# Patient Record
Sex: Female | Born: 1955 | Race: White | Hispanic: No | Marital: Married | Smoking: Never smoker
Health system: Southern US, Community
[De-identification: ages and names within clinical notes are randomized; demographics above are authoritative.]

## PROBLEM LIST (undated history)

## (undated) DIAGNOSIS — E785 Hyperlipidemia, unspecified: Secondary | ICD-10-CM

## (undated) DIAGNOSIS — I10 Essential (primary) hypertension: Secondary | ICD-10-CM

## (undated) DIAGNOSIS — Z860100 Personal history of colon polyps, unspecified: Secondary | ICD-10-CM

## (undated) DIAGNOSIS — R011 Cardiac murmur, unspecified: Secondary | ICD-10-CM

## (undated) DIAGNOSIS — M1990 Unspecified osteoarthritis, unspecified site: Secondary | ICD-10-CM

## (undated) HISTORY — DX: Unspecified osteoarthritis, unspecified site: M19.90

## (undated) HISTORY — DX: Personal history of colonic polyps: Z86.010

## (undated) HISTORY — DX: Hyperlipidemia, unspecified: E78.5

## (undated) HISTORY — DX: Personal history of colon polyps, unspecified: Z86.0100

## (undated) HISTORY — DX: Essential (primary) hypertension: I10

## (undated) HISTORY — DX: Cardiac murmur, unspecified: R01.1

## (undated) HISTORY — PX: COLONOSCOPY: SHX174

## (undated) HISTORY — PX: TUBAL LIGATION: SHX77

---

## 2005-05-28 HISTORY — PX: KNEE ARTHROSCOPY AND ARTHROTOMY: SUR84

## 2009-01-10 ENCOUNTER — Encounter: Payer: Self-pay | Admitting: Family Medicine

## 2009-02-08 ENCOUNTER — Ambulatory Visit: Payer: Self-pay | Admitting: Family Medicine

## 2009-02-11 ENCOUNTER — Ambulatory Visit: Payer: Self-pay | Admitting: Family Medicine

## 2009-02-11 DIAGNOSIS — I10 Essential (primary) hypertension: Secondary | ICD-10-CM | POA: Insufficient documentation

## 2009-02-11 DIAGNOSIS — M159 Polyosteoarthritis, unspecified: Secondary | ICD-10-CM | POA: Insufficient documentation

## 2009-02-11 DIAGNOSIS — R011 Cardiac murmur, unspecified: Secondary | ICD-10-CM | POA: Insufficient documentation

## 2009-02-11 DIAGNOSIS — E782 Mixed hyperlipidemia: Secondary | ICD-10-CM | POA: Insufficient documentation

## 2009-02-11 DIAGNOSIS — Z86010 Personal history of colonic polyps: Secondary | ICD-10-CM | POA: Insufficient documentation

## 2009-02-14 LAB — CONVERTED CEMR LAB
Basophils Absolute: 0 10*3/uL (ref 0.0–0.1)
Basophils Relative: 0.4 % (ref 0.0–3.0)
Eosinophils Absolute: 0.1 10*3/uL (ref 0.0–0.7)
Lymphocytes Relative: 21.3 % (ref 12.0–46.0)
MCHC: 33.9 g/dL (ref 30.0–36.0)
Neutrophils Relative %: 64.8 % (ref 43.0–77.0)
RBC: 4.33 M/uL (ref 3.87–5.11)
TSH: 0.82 microintl units/mL (ref 0.35–5.50)
WBC: 5.3 10*3/uL (ref 4.5–10.5)

## 2009-03-22 ENCOUNTER — Encounter (INDEPENDENT_AMBULATORY_CARE_PROVIDER_SITE_OTHER): Payer: Self-pay | Admitting: *Deleted

## 2009-05-25 ENCOUNTER — Ambulatory Visit: Payer: Self-pay | Admitting: Family Medicine

## 2009-05-25 DIAGNOSIS — L02619 Cutaneous abscess of unspecified foot: Secondary | ICD-10-CM | POA: Insufficient documentation

## 2009-05-25 DIAGNOSIS — L03039 Cellulitis of unspecified toe: Secondary | ICD-10-CM

## 2009-06-16 ENCOUNTER — Ambulatory Visit: Payer: Self-pay | Admitting: Family Medicine

## 2009-06-16 DIAGNOSIS — B353 Tinea pedis: Secondary | ICD-10-CM | POA: Insufficient documentation

## 2009-07-11 ENCOUNTER — Encounter: Payer: Self-pay | Admitting: Family Medicine

## 2009-07-20 ENCOUNTER — Encounter: Payer: Self-pay | Admitting: Family Medicine

## 2010-01-11 ENCOUNTER — Other Ambulatory Visit: Admission: RE | Admit: 2010-01-11 | Discharge: 2010-01-11 | Payer: Self-pay | Admitting: Obstetrics and Gynecology

## 2010-01-25 ENCOUNTER — Ambulatory Visit: Payer: Self-pay | Admitting: Family Medicine

## 2010-01-25 LAB — CONVERTED CEMR LAB
Bilirubin Urine: NEGATIVE
Blood in Urine, dipstick: NEGATIVE
Glucose, Urine, Semiquant: NEGATIVE
Ketones, urine, test strip: NEGATIVE
Nitrite: NEGATIVE
Protein, U semiquant: NEGATIVE
Specific Gravity, Urine: 1.015
Urobilinogen, UA: 0.2
pH: 7.5

## 2010-01-27 LAB — CONVERTED CEMR LAB
ALT: 23 units/L (ref 0–35)
Albumin: 4.3 g/dL (ref 3.5–5.2)
BUN: 14 mg/dL (ref 6–23)
Basophils Absolute: 0 10*3/uL (ref 0.0–0.1)
CO2: 30 meq/L (ref 19–32)
Chloride: 104 meq/L (ref 96–112)
Cholesterol: 174 mg/dL (ref 0–200)
Glucose, Bld: 87 mg/dL (ref 70–99)
HCT: 39.4 % (ref 36.0–46.0)
Lymphs Abs: 1.3 10*3/uL (ref 0.7–4.0)
MCV: 91.2 fL (ref 78.0–100.0)
Monocytes Absolute: 0.4 10*3/uL (ref 0.1–1.0)
Neutro Abs: 2.4 10*3/uL (ref 1.4–7.7)
Platelets: 223 10*3/uL (ref 150.0–400.0)
Potassium: 4.4 meq/L (ref 3.5–5.1)
RDW: 13.8 % (ref 11.5–14.6)
TSH: 1.17 microintl units/mL (ref 0.35–5.50)
Total Bilirubin: 0.6 mg/dL (ref 0.3–1.2)

## 2010-02-01 ENCOUNTER — Ambulatory Visit: Payer: Self-pay | Admitting: Family Medicine

## 2010-02-15 ENCOUNTER — Ambulatory Visit (HOSPITAL_COMMUNITY): Admission: RE | Admit: 2010-02-15 | Discharge: 2010-02-15 | Payer: Self-pay | Admitting: Obstetrics and Gynecology

## 2010-03-21 ENCOUNTER — Ambulatory Visit: Payer: Self-pay | Admitting: Family Medicine

## 2010-03-21 DIAGNOSIS — L84 Corns and callosities: Secondary | ICD-10-CM | POA: Insufficient documentation

## 2010-04-03 ENCOUNTER — Telehealth: Payer: Self-pay | Admitting: Family Medicine

## 2010-06-27 NOTE — Assessment & Plan Note (Signed)
Summary: cpx//ccm   Vital Signs:  Patient profile:   55 year old female Height:      66.5 inches Weight:      181 pounds BMI:     28.88 O2 Sat:      98 % Temp:     98.4 degrees F Pulse rate:   77 / minute BP sitting:   140 / 90  (left arm)  Vitals Entered By: Pura Spice, RN (February 01, 2010 3:05 PM) CC: cpx discuss labs rx to [facility]  No Pap sees Dr Ginnie Smart last pap aug 2011 Is Patient Diabetic? No   History of Present Illness: 55 yr old female for a cpx. She feels fine and has no concerns.   Allergies: 1)  ! Sulfa  Past History:  Past Medical History: Colonic polyps, hx of Hyperlipidemia Hypertension Osteoarthritis Heart Murmur sees Dr. Patsy Baltimore for GYN exams  Past Surgical History: Reviewed history from 02/11/2009 and no changes required. arthroscopy for Torn meniscus lt knee 2007 Tubal ligation 1995 colonoscopy 2009, to repeat in 5 yrs  Past History:  Care Management: Gynecology: Dr Ginnie Smart  Family History: Reviewed history from 02/11/2009 and no changes required. Family History of Alcoholism/Addiction Family History of Arthritis Family History of CAD Female 1st degree relative <50 Family History Diabetes 1st degree relative Family History Lung cancer  Social History: Reviewed history from 02/11/2009 and no changes required. Married Never Smoked Alcohol use-yes Drug use-no Regular exercise-yes  Review of Systems  The patient denies anorexia, fever, weight loss, weight gain, vision loss, decreased hearing, hoarseness, chest pain, syncope, dyspnea on exertion, peripheral edema, prolonged cough, headaches, hemoptysis, abdominal pain, melena, hematochezia, severe indigestion/heartburn, hematuria, incontinence, genital sores, muscle weakness, suspicious skin lesions, transient blindness, difficulty walking, depression, unusual weight change, abnormal bleeding, enlarged lymph nodes, angioedema, breast masses, and testicular masses.           Flu Vaccine Consent Questions     Do you have a history of severe allergic reactions to this vaccine? no    Any prior history of allergic reactions to egg and/or gelatin? no    Do you have a sensitivity to the preservative Thimersol? no    Do you have a past history of Guillan-Barre Syndrome? no    Do you currently have an acute febrile illness? no    Have you ever had a severe reaction to latex? no    Vaccine information given and explained to patient? yes    Are you currently pregnant? no    Lot Number:[id number]   Exp Date:11/24/2009   Site Given  Left Deltoid IM Pura Spice, RN  February 01, 2010 3:09 PM   Physical Exam  General:  Well-developed,well-nourished,in no acute distress; alert,appropriate and cooperative throughout examination Head:  Normocephalic and atraumatic without obvious abnormalities. No apparent alopecia or balding. Eyes:  No corneal or conjunctival inflammation noted. EOMI. Perrla. Funduscopic exam benign, without hemorrhages, exudates or papilledema. Vision grossly normal. Ears:  External ear exam shows no significant lesions or deformities.  Otoscopic examination reveals clear canals, tympanic membranes are intact bilaterally without bulging, retraction, inflammation or discharge. Hearing is grossly normal bilaterally. Nose:  External nasal examination shows no deformity or inflammation. Nasal mucosa are pink and moist without lesions or exudates. Mouth:  Oral mucosa and oropharynx without lesions or exudates.  Teeth in good repair. Neck:  No deformities, masses, or tenderness noted. Chest Wall:  No deformities, masses, or tenderness noted. Lungs:  Normal respiratory  effort, chest expands symmetrically. Lungs are clear to auscultation, no crackles or wheezes. Heart:  Normal rate and regular rhythm. S1 and S2 normal without gallop, murmur, click, rub or other extra sounds. EKG normal Abdomen:  Bowel sounds positive,abdomen soft and non-tender without  masses, organomegaly or hernias noted. Msk:  No deformity or scoliosis noted of thoracic or lumbar spine.   Pulses:  R and L carotid,radial,femoral,dorsalis pedis and posterior tibial pulses are full and equal bilaterally Extremities:  No clubbing, cyanosis, edema, or deformity noted with normal full range of motion of all joints.   Neurologic:  No cranial nerve deficits noted. Station and gait are normal. Plantar reflexes are down-going bilaterally. DTRs are symmetrical throughout. Sensory, motor and coordinative functions appear intact. Skin:  Intact without suspicious lesions or rashes Cervical Nodes:  No lymphadenopathy noted Axillary Nodes:  No palpable lymphadenopathy Inguinal Nodes:  No significant adenopathy Psych:  Cognition and judgment appear intact. Alert and cooperative with normal attention span and concentration. No apparent delusions, illusions, hallucinations   Impression & Recommendations:  Problem # 1:  HEALTH MAINTENANCE EXAM (ICD-V70.0)  Orders: EKG w/ Interpretation (93000)  Complete Medication List: 1)  Micardis Hct 40-12.5 Mg Tabs (Telmisartan-hctz) .[name].. 1 by mouth once daily 2)  Calcium 1500 Mg Tabs (Calcium carbonate) .... Once daily 3)  B Complex 100 Tabs (B complex vitamins) .... Once daily 4)  Zocor 40 Mg Tabs (Simvastatin) .... Once daily 5)  Ketoconazole 2 % Crea (Ketoconazole) .... Apply three times a day as needed  Other Orders: Admin 1st Vaccine (81191) Flu Vaccine 10yrs + (47829)  Patient Instructions: 1)  Please schedule a follow-up appointment in 6 months .  Prescriptions: ZOCOR 40 MG TABS (SIMVASTATIN) once daily  #30 x 11   Entered and Authorized by:   Nelwyn Salisbury MD   Signed by:   Nelwyn Salisbury MD on 02/01/2010   Method used:   Electronically to        [facility] [address] [phone]* (retail)       [address] [place], [place]  [address]       Ph: [phone]       Fax: [phone]   RxID:   [id number] MICARDIS HCT 40-12.5 MG TABS  (TELMISARTAN-HCTZ) 1 by mouth once daily  #30 x 11   Entered and Authorized by:   Nelwyn Salisbury MD   Signed by:   Nelwyn Salisbury MD on 02/01/2010   Method used:   Electronically to        [facility] [address] [phone]* (retail)       [address] [place], [place]  [address]       Ph: [phone]       Fax: [phone]   RxID:   [id number]

## 2010-06-27 NOTE — Assessment & Plan Note (Signed)
Summary: right knee pain/toe recheck/njr   Vital Signs:  Patient profile:   55 year old female Weight:      178 pounds Temp:     98.0 degrees F oral Pulse rate:   90 / minute BP sitting:   114 / 76  (left arm) Cuff size:   large  Vitals Entered By: Alfred Levins, CMA (June 16, 2009 8:43 AM) CC: lt toe infected, rt knee swollen   History of Present Illness: Here for continued infection on the left toes and for right knee pain. She was here last month for the toes and was given Keflex. The area improved but she still has irritation in the area. Also she has had swelling and pain in the right knee for several weeks. it started after she ran a 5K race last month, and she does not usually run much. In the gym she has stopped using the treadmill and is using an elliptical machine instead. Using Motrin which works well but does not last very long.   Current Medications (verified): 1)  Micardis Hct 40-12.5 Mg Tabs (Telmisartan-Hctz) .[name].. 1 By Mouth Once Daily 2)  Calcium 1500 Mg Tabs (Calcium Carbonate) .... Once Daily 3)  B Complex 100  Tabs (B Complex Vitamins) .... Once Daily 4)  Zocor 40 Mg Tabs (Simvastatin) .... Once Daily  Allergies (verified): 1)  ! Sulfa  Past History:  Past Medical History: Reviewed history from 02/11/2009 and no changes required. Colonic polyps, hx of Hyperlipidemia Hypertension Osteoarthritis Heart Murmur  Past Surgical History: Reviewed history from 02/11/2009 and no changes required. arthroscopy for Torn meniscus lt knee 2007 Tubal ligation 1995 colonoscopy 2009, to repeat in 5 yrs  Review of Systems  The patient denies anorexia, fever, weight loss, weight gain, vision loss, decreased hearing, hoarseness, chest pain, syncope, dyspnea on exertion, peripheral edema, prolonged cough, headaches, hemoptysis, abdominal pain, melena, hematochezia, severe indigestion/heartburn, hematuria, incontinence, genital sores, muscle weakness, suspicious skin  lesions, transient blindness, difficulty walking, depression, unusual weight change, abnormal bleeding, enlarged lymph nodes, angioedema, breast masses, and testicular masses.    Physical Exam  General:  Well-developed,well-nourished,in no acute distress; alert,appropriate and cooperative throughout examination Extremities:  the right knee has a lot of crepitus and is slightly puffy around the joint spaces. Joint spaces are mildly tender. ROM is full Skin:  the webbing between the left 4th and 5th toes is red and macerated   Impression & Recommendations:  Problem # 1:  TINEA PEDIS (ICD-110.4)  Her updated medication list for this problem includes:    Ketoconazole 2 % Crea (Ketoconazole) .[name]... Apply three times a day as needed  Problem # 2:  OSTEOARTHRITIS (ICD-715.90)  Her updated medication list for this problem includes:    Aleve 220 Mg Tabs (Naproxen sodium) .[name]... 1-2 two times a day as needed  Complete Medication List: 1)  Micardis Hct 40-12.5 Mg Tabs (Telmisartan-hctz) .[name].. 1 by mouth once daily 2)  Calcium 1500 Mg Tabs (Calcium carbonate) .... Once daily 3)  B Complex 100 Tabs (B complex vitamins) .... Once daily 4)  Zocor 40 Mg Tabs (Simvastatin) .... Once daily 5)  Ketoconazole 2 % Crea (Ketoconazole) .... Apply three times a day as needed 6)  Aleve 220 Mg Tabs (Naproxen sodium) .[name].. 1-2 two times a day as needed  Patient Instructions: 1)  The cellulitis in between the toes has resolved but the underlying fungal infection remains. Keep the area dry and go barefoot as much as possible. Apply ketoconazole. Try Albertine Grates  for the knee pain, and avoid running for now. Prescriptions: KETOCONAZOLE 2 % CREA (KETOCONAZOLE) apply three times a day as needed  #60 x 2   Entered and Authorized by:   Nelwyn Salisbury MD   Signed by:   Nelwyn Salisbury MD on 06/16/2009   Method used:   Electronically to        [facility] [address] [phone]* (retail)       [address] [place], [place]   [address]       Ph: [phone]       Fax: [phone]   RxID:   [id number]

## 2010-06-27 NOTE — Progress Notes (Signed)
Summary: rx micardis   Phone Note From Pharmacy   Caller: [facility] [address] [phone]* Summary of Call: refill micardis  Initial call taken by: Pura Spice, RN,  April 03, 2010 3:55 PM  Follow-up for Phone Call        done.  Follow-up by: Pura Spice, RN,  April 03, 2010 3:56 PM    Prescriptions: MICARDIS HCT 40-12.5 MG TABS (TELMISARTAN-HCTZ) 1 by mouth once daily  #30 x 6   Entered by:   Pura Spice, RN   Authorized by:   Nelwyn Salisbury MD   Signed by:   Pura Spice, RN on 04/03/2010   Method used:   Electronically to        [facility] [address] [phone]* (retail)       [address] [place], [place]  [address]       Ph: [phone]       Fax: [phone]   RxID:   [id number]

## 2010-06-27 NOTE — Letter (Signed)
Summary: [facility]  [facility]   Imported By: Maryln Gottron 07/27/2009 13:32:50  _____________________________________________________________________  External Attachment:    Type:   Image     Comment:   External Document

## 2010-06-27 NOTE — Assessment & Plan Note (Signed)
Summary: PROBLEM WITH TOE//SLM   Vital Signs:  Patient profile:   55 year old female Weight:      184 pounds BMI:     29.36 O2 Sat:      98 % Temp:     98.7 degrees F Pulse rate:   93 / minute BP sitting:   134 / 90  (left arm)  Vitals Entered By: Pura Spice, RN (March 21, 2010 2:06 PM)  Contraindications/Deferment of Procedures/Staging:    Test/Procedure: FLU VAX    Reason for deferment: patient declined  CC: reck toe area between 4th and 5th toe .   History of Present Illness: Here with irritation between the 4th and 5th toes of the left foot that has bothered her for about 2 weeks. This area developed a cellulitis a year ago. She thinks it doesn't bother her during the warm weather months because she wears sandals or losse fiiting shoes. Then with coller weather she wears tightershoes that compress this area.   Allergies: 1)  ! Sulfa  Past History:  Past Medical History: Reviewed history from 02/01/2010 and no changes required. Colonic polyps, hx of Hyperlipidemia Hypertension Osteoarthritis Heart Murmur sees Dr. Patsy Baltimore for GYN exams  Review of Systems  The patient denies anorexia, fever, weight loss, weight gain, vision loss, decreased hearing, hoarseness, chest pain, syncope, dyspnea on exertion, peripheral edema, prolonged cough, headaches, hemoptysis, abdominal pain, melena, hematochezia, severe indigestion/heartburn, hematuria, incontinence, genital sores, muscle weakness, suspicious skin lesions, transient blindness, difficulty walking, depression, unusual weight change, abnormal bleeding, enlarged lymph nodes, angioedema, breast masses, and testicular masses.    Physical Exam  General:  Well-developed,well-nourished,in no acute distress; alert,appropriate and cooperative throughout examination Skin:  the area between these toes has a small tender corn or callus . The skin has no erythema   Impression & Recommendations:  Problem # 1:  CORNS AND  CALLOSITIES (ICD-700)  Orders: Podiatry Referral (Podiatry)  Complete Medication List: 1)  Micardis Hct 40-12.5 Mg Tabs (Telmisartan-hctz) .[name].. 1 by mouth once daily 2)  Calcium 1500 Mg Tabs (Calcium carbonate) .... Once daily 3)  B Complex 100 Tabs (B complex vitamins) .... Once daily 4)  Zocor 40 Mg Tabs (Simvastatin) .... Once daily 5)  Ketoconazole 2 % Crea (Ketoconazole) .... Apply three times a day as needed  Patient Instructions: 1)  We  will refer her to Podiatry to remove this    Orders Added: 1)  Est. Patient Level III [[address]] 2)  Podiatry Referral [Podiatry]

## 2010-06-27 NOTE — Letter (Signed)
Summary: [facility]  [facility]   Imported By: Maryln Gottron 07/21/2009 10:02:11  _____________________________________________________________________  External Attachment:    Type:   Image     Comment:   External Document

## 2010-10-30 ENCOUNTER — Other Ambulatory Visit: Payer: Self-pay | Admitting: Family Medicine

## 2011-01-24 ENCOUNTER — Other Ambulatory Visit: Payer: Self-pay

## 2011-01-30 ENCOUNTER — Other Ambulatory Visit (INDEPENDENT_AMBULATORY_CARE_PROVIDER_SITE_OTHER): Payer: BC Managed Care – PPO

## 2011-01-30 DIAGNOSIS — Z0000 Encounter for general adult medical examination without abnormal findings: Secondary | ICD-10-CM

## 2011-01-30 LAB — POCT URINALYSIS DIPSTICK
Bilirubin, UA: NEGATIVE
Glucose, UA: NEGATIVE
Ketones, UA: NEGATIVE
Leukocytes, UA: NEGATIVE
Nitrite, UA: NEGATIVE
pH, UA: 6

## 2011-01-30 LAB — BASIC METABOLIC PANEL
CO2: 27 mEq/L (ref 19–32)
Calcium: 9.3 mg/dL (ref 8.4–10.5)
Chloride: 103 mEq/L (ref 96–112)
Sodium: 141 mEq/L (ref 135–145)

## 2011-01-30 LAB — HEPATIC FUNCTION PANEL
ALT: 31 U/L (ref 0–35)
AST: 36 U/L (ref 0–37)
Albumin: 4.5 g/dL (ref 3.5–5.2)
Alkaline Phosphatase: 95 U/L (ref 39–117)
Bilirubin, Direct: 0 mg/dL (ref 0.0–0.3)
Total Protein: 7.3 g/dL (ref 6.0–8.3)

## 2011-01-30 LAB — CBC WITH DIFFERENTIAL/PLATELET
Basophils Relative: 0.8 % (ref 0.0–3.0)
Eosinophils Absolute: 0.1 10*3/uL (ref 0.0–0.7)
Eosinophils Relative: 2.4 % (ref 0.0–5.0)
Hemoglobin: 13.5 g/dL (ref 12.0–15.0)
Lymphocytes Relative: 27 % (ref 12.0–46.0)
MCHC: 32.9 g/dL (ref 30.0–36.0)
Neutro Abs: 2.7 10*3/uL (ref 1.4–7.7)
Neutrophils Relative %: 61.2 % (ref 43.0–77.0)
RBC: 4.44 Mil/uL (ref 3.87–5.11)
WBC: 4.4 10*3/uL — ABNORMAL LOW (ref 4.5–10.5)

## 2011-01-30 LAB — TSH: TSH: 1.24 u[IU]/mL (ref 0.35–5.50)

## 2011-01-30 LAB — LIPID PANEL: Total CHOL/HDL Ratio: 3

## 2011-02-05 ENCOUNTER — Ambulatory Visit (INDEPENDENT_AMBULATORY_CARE_PROVIDER_SITE_OTHER): Payer: BC Managed Care – PPO | Admitting: Family Medicine

## 2011-02-05 ENCOUNTER — Encounter: Payer: Self-pay | Admitting: Family Medicine

## 2011-02-05 VITALS — BP 124/76 | HR 86 | Temp 98.4°F | Ht 67.0 in | Wt 182.0 lb

## 2011-02-05 DIAGNOSIS — Z0000 Encounter for general adult medical examination without abnormal findings: Secondary | ICD-10-CM

## 2011-02-05 MED ORDER — SIMVASTATIN 40 MG PO TABS: 40.0000 mg | ORAL_TABLET | Freq: Every day | ORAL | Status: DC

## 2011-02-05 MED ORDER — LOSARTAN POTASSIUM-HCTZ 100-12.5 MG PO TABS: 1.0000 | ORAL_TABLET | Freq: Every day | ORAL | Status: DC

## 2011-02-05 NOTE — Progress Notes (Signed)
  Subjective:    Patient ID: Melissa Bryant, female    DOB: 11-04-1955, 55 y.o.   MRN: [id number]  HPI 55 yr old female for a cpx. She feels fine except for some chronic left knee pain. She takes Aleve for this. No other complaints.    Review of Systems  Constitutional: Negative.   HENT: Negative.   Eyes: Negative.   Respiratory: Negative.   Cardiovascular: Negative.   Gastrointestinal: Negative.   Genitourinary: Negative for dysuria, urgency, frequency, hematuria, flank pain, decreased urine volume, enuresis, difficulty urinating, pelvic pain and dyspareunia.  Musculoskeletal: Negative.   Skin: Negative.   Neurological: Negative.   Hematological: Negative.   Psychiatric/Behavioral: Negative.        Objective:   Physical Exam  Constitutional: She is oriented to person, place, and time. She appears well-developed and well-nourished. No distress.  HENT:  Head: Normocephalic and atraumatic.  Right Ear: External ear normal.  Left Ear: External ear normal.  Nose: Nose normal.  Mouth/Throat: Oropharynx is clear and moist. No oropharyngeal exudate.  Eyes: Conjunctivae and EOM are normal. Pupils are equal, round, and reactive to light. No scleral icterus.  Neck: Normal range of motion. Neck supple. No JVD present. No thyromegaly present.  Cardiovascular: Normal rate, regular rhythm, normal heart sounds and intact distal pulses.  Exam reveals no gallop and no friction rub.   No murmur heard.      EKG normal   Pulmonary/Chest: Effort normal and breath sounds normal. No respiratory distress. She has no wheezes. She has no rales. She exhibits no tenderness.  Abdominal: Soft. Bowel sounds are normal. She exhibits no distension and no mass. There is no tenderness. There is no rebound and no guarding.  Musculoskeletal: Normal range of motion. She exhibits no edema and no tenderness.  Lymphadenopathy:    She has no cervical adenopathy.  Neurological: She is alert and oriented to person, place,  and time. She has normal reflexes. No cranial nerve deficit. She exhibits normal muscle tone. Coordination normal.  Skin: Skin is warm and dry. No rash noted. No erythema.  Psychiatric: She has a normal mood and affect. Her behavior is normal. Judgment and thought content normal.          Assessment & Plan:  We discussed diet and exercise. Switch to Losartan HCT to save money.

## 2011-02-19 ENCOUNTER — Other Ambulatory Visit (HOSPITAL_COMMUNITY)
Admission: RE | Admit: 2011-02-19 | Discharge: 2011-02-19 | Disposition: A | Discharge status: Home / Self Care | Payer: BC Managed Care – PPO | Source: Ambulatory Visit | Attending: Obstetrics and Gynecology | Admitting: Obstetrics and Gynecology

## 2011-02-19 ENCOUNTER — Other Ambulatory Visit: Payer: Self-pay | Admitting: Obstetrics and Gynecology

## 2011-02-19 DIAGNOSIS — Z01419 Encounter for gynecological examination (general) (routine) without abnormal findings: Secondary | ICD-10-CM | POA: Insufficient documentation

## 2011-02-27 ENCOUNTER — Other Ambulatory Visit: Payer: Self-pay | Admitting: Obstetrics and Gynecology

## 2011-02-27 DIAGNOSIS — Z1231 Encounter for screening mammogram for malignant neoplasm of breast: Secondary | ICD-10-CM

## 2011-02-28 ENCOUNTER — Ambulatory Visit
Admission: RE | Admit: 2011-02-28 | Discharge: 2011-02-28 | Disposition: A | Discharge status: Home / Self Care | Payer: BC Managed Care – PPO | Source: Ambulatory Visit | Attending: Obstetrics and Gynecology | Admitting: Obstetrics and Gynecology

## 2011-02-28 DIAGNOSIS — Z1231 Encounter for screening mammogram for malignant neoplasm of breast: Secondary | ICD-10-CM

## 2012-02-02 ENCOUNTER — Ambulatory Visit: Payer: BC Managed Care – PPO | Admitting: Family Medicine

## 2012-02-14 ENCOUNTER — Other Ambulatory Visit: Payer: Self-pay | Admitting: Family Medicine

## 2012-02-19 ENCOUNTER — Other Ambulatory Visit (INDEPENDENT_AMBULATORY_CARE_PROVIDER_SITE_OTHER): Payer: BC Managed Care – PPO

## 2012-02-19 DIAGNOSIS — Z0000 Encounter for general adult medical examination without abnormal findings: Secondary | ICD-10-CM

## 2012-02-19 LAB — CBC WITH DIFFERENTIAL/PLATELET
Basophils Relative: 0.9 % (ref 0.0–3.0)
Eosinophils Relative: 3 % (ref 0.0–5.0)
HCT: 40.9 % (ref 36.0–46.0)
Lymphs Abs: 1.2 10*3/uL (ref 0.7–4.0)
MCV: 90.4 fl (ref 78.0–100.0)
Monocytes Relative: 11.5 % (ref 3.0–12.0)
Neutrophils Relative %: 50.9 % (ref 43.0–77.0)
Platelets: 250 10*3/uL (ref 150.0–400.0)
RBC: 4.52 Mil/uL (ref 3.87–5.11)
WBC: 3.6 10*3/uL — ABNORMAL LOW (ref 4.5–10.5)

## 2012-02-19 LAB — BASIC METABOLIC PANEL
BUN: 13 mg/dL (ref 6–23)
Chloride: 104 mEq/L (ref 96–112)
GFR: 77.6 mL/min (ref >60.00)
Potassium: 4.6 mEq/L (ref 3.5–5.1)

## 2012-02-19 LAB — HEPATIC FUNCTION PANEL
ALT: 30 U/L (ref 0–35)
AST: 32 U/L (ref 0–37)
Bilirubin, Direct: 0.1 mg/dL (ref 0.0–0.3)
Total Bilirubin: 0.9 mg/dL (ref 0.3–1.2)
Total Protein: 7.3 g/dL (ref 6.0–8.3)

## 2012-02-19 LAB — POCT URINALYSIS DIPSTICK
Bilirubin, UA: NEGATIVE
Blood, UA: NEGATIVE
Ketones, UA: NEGATIVE
Protein, UA: NEGATIVE
pH, UA: 7

## 2012-02-19 LAB — LIPID PANEL
HDL: 51.8 mg/dL (ref >39.00)
LDL Cholesterol: 101 mg/dL — ABNORMAL HIGH (ref 0–99)
Total CHOL/HDL Ratio: 3
VLDL: 22.6 mg/dL (ref 0.0–40.0)

## 2012-02-20 NOTE — Progress Notes (Signed)
Quick Note:  I left voice message with normal results. ______ 

## 2012-02-26 ENCOUNTER — Encounter: Payer: Self-pay | Admitting: Family Medicine

## 2012-02-26 ENCOUNTER — Ambulatory Visit (INDEPENDENT_AMBULATORY_CARE_PROVIDER_SITE_OTHER): Payer: BC Managed Care – PPO | Admitting: Family Medicine

## 2012-02-26 VITALS — BP 126/78 | HR 86 | Temp 98.5°F | Wt 186.0 lb

## 2012-02-26 DIAGNOSIS — Z0000 Encounter for general adult medical examination without abnormal findings: Secondary | ICD-10-CM

## 2012-02-26 DIAGNOSIS — Z23 Encounter for immunization: Secondary | ICD-10-CM

## 2012-02-26 MED ORDER — SIMVASTATIN 40 MG PO TABS: 40.0000 mg | ORAL_TABLET | Freq: Every day | ORAL | Status: DC

## 2012-02-26 MED ORDER — TRIAMCINOLONE ACETONIDE 0.1 % EX CREA: TOPICAL_CREAM | CUTANEOUS | Status: DC | PRN

## 2012-02-26 MED ORDER — LOSARTAN POTASSIUM-HCTZ 100-12.5 MG PO TABS: 1.0000 | ORAL_TABLET | Freq: Every day | ORAL | Status: DC

## 2012-02-26 MED ORDER — FLUTICASONE PROPIONATE 50 MCG/ACT NA SUSP: 2.0000 | Freq: Every day | NASAL | Status: DC

## 2012-02-26 NOTE — Progress Notes (Signed)
  Subjective:    Patient ID: Melissa Bryant, female    DOB: [birth year], 56 y.o.   MRN: [id number]  HPI 56 yr old female for a cpx. She feels well except for chronic sinus and nasal congestion, and she feels a non-tender lump in the right anterior nostril. No nosebleeds.    Review of Systems  Constitutional: Negative.   HENT: Negative.   Eyes: Negative.   Respiratory: Negative.   Cardiovascular: Negative.   Gastrointestinal: Negative.   Genitourinary: Negative for dysuria, urgency, frequency, hematuria, flank pain, decreased urine volume, enuresis, difficulty urinating, pelvic pain and dyspareunia.  Musculoskeletal: Negative.   Skin: Negative.   Neurological: Negative.   Hematological: Negative.   Psychiatric/Behavioral: Negative.        Objective:   Physical Exam  Constitutional: She is oriented to person, place, and time. She appears well-developed and well-nourished. No distress.  HENT:  Head: Normocephalic and atraumatic.  Right Ear: External ear normal.  Left Ear: External ear normal.  Mouth/Throat: Oropharynx is clear and moist. No oropharyngeal exudate.       The nasal membranes are red and swollen. There is a small shelf of hypertrophied membrane along the anterior septum of the right nostril.   Eyes: Conjunctivae normal and EOM are normal. Pupils are equal, round, and reactive to light. No scleral icterus.  Neck: Normal range of motion. Neck supple. No JVD present. No thyromegaly present.  Cardiovascular: Normal rate, regular rhythm, normal heart sounds and intact distal pulses.  Exam reveals no gallop and no friction rub.   No murmur heard.      EKG normal   Pulmonary/Chest: Effort normal and breath sounds normal. No respiratory distress. She has no wheezes. She has no rales. She exhibits no tenderness.  Abdominal: Soft. Bowel sounds are normal. She exhibits no distension and no mass. There is no tenderness. There is no rebound and no guarding.  Musculoskeletal: Normal  range of motion. She exhibits no edema and no tenderness.  Lymphadenopathy:    She has no cervical adenopathy.  Neurological: She is alert and oriented to person, place, and time. She has normal reflexes. No cranial nerve deficit. She exhibits normal muscle tone. Coordination normal.  Skin: Skin is warm and dry. No rash noted. No erythema.  Psychiatric: She has a normal mood and affect. Her behavior is normal. Judgment and thought content normal.          Assessment & Plan:  Well exam. Get regular exercise. Try Flonase for the allergy symptoms. Hopefully this will reduce the inflammation along the nasal septum.

## 2012-02-27 ENCOUNTER — Other Ambulatory Visit: Payer: Self-pay | Admitting: Obstetrics and Gynecology

## 2012-02-27 ENCOUNTER — Other Ambulatory Visit (HOSPITAL_COMMUNITY)
Admission: RE | Admit: 2012-02-27 | Discharge: 2012-02-27 | Disposition: A | Discharge status: Home / Self Care | Payer: BC Managed Care – PPO | Source: Ambulatory Visit | Attending: Obstetrics and Gynecology | Admitting: Obstetrics and Gynecology

## 2012-02-27 DIAGNOSIS — Z01419 Encounter for gynecological examination (general) (routine) without abnormal findings: Secondary | ICD-10-CM | POA: Insufficient documentation

## 2012-03-14 ENCOUNTER — Other Ambulatory Visit (HOSPITAL_COMMUNITY): Payer: Self-pay | Admitting: Obstetrics and Gynecology

## 2012-03-14 DIAGNOSIS — Z1231 Encounter for screening mammogram for malignant neoplasm of breast: Secondary | ICD-10-CM

## 2012-04-01 ENCOUNTER — Ambulatory Visit (HOSPITAL_COMMUNITY)
Admission: RE | Admit: 2012-04-01 | Discharge: 2012-04-01 | Disposition: A | Discharge status: Home / Self Care | Payer: BC Managed Care – PPO | Source: Ambulatory Visit | Attending: Obstetrics and Gynecology | Admitting: Obstetrics and Gynecology

## 2012-04-01 DIAGNOSIS — Z1231 Encounter for screening mammogram for malignant neoplasm of breast: Secondary | ICD-10-CM

## 2013-03-02 ENCOUNTER — Other Ambulatory Visit (HOSPITAL_COMMUNITY)
Admission: RE | Admit: 2013-03-02 | Discharge: 2013-03-02 | Disposition: A | Discharge status: Home / Self Care | Payer: BC Managed Care – PPO | Source: Ambulatory Visit | Attending: Obstetrics and Gynecology | Admitting: Obstetrics and Gynecology

## 2013-03-02 ENCOUNTER — Other Ambulatory Visit: Payer: Self-pay | Admitting: Obstetrics and Gynecology

## 2013-03-02 DIAGNOSIS — Z1151 Encounter for screening for human papillomavirus (HPV): Secondary | ICD-10-CM | POA: Insufficient documentation

## 2013-03-02 DIAGNOSIS — Z01419 Encounter for gynecological examination (general) (routine) without abnormal findings: Secondary | ICD-10-CM | POA: Insufficient documentation

## 2013-03-03 ENCOUNTER — Other Ambulatory Visit: Payer: Self-pay | Admitting: Family Medicine

## 2013-03-03 NOTE — Telephone Encounter (Signed)
Can we refill this? 

## 2013-03-19 ENCOUNTER — Other Ambulatory Visit: Payer: BC Managed Care – PPO

## 2013-03-20 ENCOUNTER — Other Ambulatory Visit (INDEPENDENT_AMBULATORY_CARE_PROVIDER_SITE_OTHER): Payer: BC Managed Care – PPO

## 2013-03-20 DIAGNOSIS — Z0000 Encounter for general adult medical examination without abnormal findings: Secondary | ICD-10-CM

## 2013-03-20 LAB — POCT URINALYSIS DIPSTICK
Blood, UA: NEGATIVE
Glucose, UA: NEGATIVE
Nitrite, UA: NEGATIVE
Protein, UA: NEGATIVE
Urobilinogen, UA: 0.2

## 2013-03-20 LAB — CBC WITH DIFFERENTIAL/PLATELET
Basophils Absolute: 0 10*3/uL (ref 0.0–0.1)
Eosinophils Relative: 2.4 % (ref 0.0–5.0)
Hemoglobin: 13.8 g/dL (ref 12.0–15.0)
Lymphocytes Relative: 29.4 % (ref 12.0–46.0)
Monocytes Relative: 12.3 % — ABNORMAL HIGH (ref 3.0–12.0)
Neutro Abs: 2.4 10*3/uL (ref 1.4–7.7)
RBC: 4.51 Mil/uL (ref 3.87–5.11)
RDW: 14.1 % (ref 11.5–14.6)
WBC: 4.4 10*3/uL — ABNORMAL LOW (ref 4.5–10.5)

## 2013-03-20 LAB — TSH: TSH: 1.45 u[IU]/mL (ref 0.35–5.50)

## 2013-03-20 LAB — BASIC METABOLIC PANEL
Calcium: 9.6 mg/dL (ref 8.4–10.5)
GFR: 87.16 mL/min (ref >60.00)
Sodium: 139 mEq/L (ref 135–145)

## 2013-03-20 LAB — LIPID PANEL
HDL: 60.3 mg/dL (ref >39.00)
VLDL: 24 mg/dL (ref 0.0–40.0)

## 2013-03-20 LAB — HEPATIC FUNCTION PANEL
ALT: 29 U/L (ref 0–35)
Total Bilirubin: 0.7 mg/dL (ref 0.3–1.2)
Total Protein: 7.4 g/dL (ref 6.0–8.3)

## 2013-03-22 ENCOUNTER — Other Ambulatory Visit: Payer: Self-pay | Admitting: Family Medicine

## 2013-03-24 NOTE — Progress Notes (Signed)
Quick Note:  Pt has appointment on 03/31/13 will go over then. ______ 

## 2013-03-31 ENCOUNTER — Encounter: Payer: Self-pay | Admitting: Family Medicine

## 2013-03-31 ENCOUNTER — Ambulatory Visit (INDEPENDENT_AMBULATORY_CARE_PROVIDER_SITE_OTHER): Payer: BC Managed Care – PPO | Admitting: Family Medicine

## 2013-03-31 VITALS — BP 140/90 | HR 95 | Temp 98.2°F | Ht 66.5 in | Wt 186.0 lb

## 2013-03-31 DIAGNOSIS — M25569 Pain in unspecified knee: Secondary | ICD-10-CM

## 2013-03-31 DIAGNOSIS — Z23 Encounter for immunization: Secondary | ICD-10-CM

## 2013-03-31 DIAGNOSIS — Z0000 Encounter for general adult medical examination without abnormal findings: Secondary | ICD-10-CM

## 2013-03-31 DIAGNOSIS — M25561 Pain in right knee: Secondary | ICD-10-CM

## 2013-03-31 MED ORDER — SIMVASTATIN 40 MG PO TABS: ORAL_TABLET | ORAL | Status: DC

## 2013-03-31 MED ORDER — LOSARTAN POTASSIUM-HCTZ 100-12.5 MG PO TABS: ORAL_TABLET | ORAL | Status: DC

## 2013-04-01 ENCOUNTER — Encounter: Payer: Self-pay | Admitting: Family Medicine

## 2013-04-01 NOTE — Progress Notes (Signed)
  Subjective:    Patient ID: Melissa Bryant, female    DOB: March 23, 1956, 57 y.o.   MRN: [id number]  HPI 57 yr old female for a cpx. She has several complaints. First she has had pain in the right knee for about 6 months and it is getting worse. Using Ibuprofen. Also she is due for another colonoscopy.    Review of Systems  Constitutional: Negative.   HENT: Negative.   Eyes: Negative.   Respiratory: Negative.   Cardiovascular: Negative.   Gastrointestinal: Negative.   Genitourinary: Negative for dysuria, urgency, frequency, hematuria, flank pain, decreased urine volume, enuresis, difficulty urinating, pelvic pain and dyspareunia.  Musculoskeletal: Positive for arthralgias. Negative for back pain, gait problem, joint swelling, myalgias, neck pain and neck stiffness.  Skin: Negative.   Neurological: Negative.   Psychiatric/Behavioral: Negative.        Objective:   Physical Exam  Constitutional: She is oriented to person, place, and time. She appears well-developed and well-nourished. No distress.  HENT:  Head: Normocephalic and atraumatic.  Right Ear: External ear normal.  Left Ear: External ear normal.  Nose: Nose normal.  Mouth/Throat: Oropharynx is clear and moist. No oropharyngeal exudate.  Eyes: Conjunctivae and EOM are normal. Pupils are equal, round, and reactive to light. No scleral icterus.  Neck: Normal range of motion. Neck supple. No JVD present. No thyromegaly present.  Cardiovascular: Normal rate, regular rhythm, normal heart sounds and intact distal pulses.  Exam reveals no gallop and no friction rub.   No murmur heard. EKG normal   Pulmonary/Chest: Effort normal and breath sounds normal. No respiratory distress. She has no wheezes. She has no rales. She exhibits no tenderness.  Abdominal: Soft. Bowel sounds are normal. She exhibits no distension and no mass. There is no tenderness. There is no rebound and no guarding.  Musculoskeletal: Normal range of motion. She  exhibits no edema and no tenderness.  Lymphadenopathy:    She has no cervical adenopathy.  Neurological: She is alert and oriented to person, place, and time. She has normal reflexes. No cranial nerve deficit. She exhibits normal muscle tone. Coordination normal.  Skin: Skin is warm and dry. No rash noted. No erythema.  Psychiatric: She has a normal mood and affect. Her behavior is normal. Judgment and thought content normal.          Assessment & Plan:  Well exam. Refer for the colonoscopy. Refer to orthopedics for the knee.

## 2014-03-02 ENCOUNTER — Other Ambulatory Visit (HOSPITAL_COMMUNITY)
Admission: RE | Admit: 2014-03-02 | Discharge: 2014-03-02 | Disposition: A | Discharge status: Home / Self Care | Payer: BC Managed Care – PPO | Source: Ambulatory Visit | Attending: Obstetrics and Gynecology | Admitting: Obstetrics and Gynecology

## 2014-03-02 ENCOUNTER — Other Ambulatory Visit: Payer: Self-pay | Admitting: Obstetrics and Gynecology

## 2014-03-02 DIAGNOSIS — Z01419 Encounter for gynecological examination (general) (routine) without abnormal findings: Secondary | ICD-10-CM | POA: Diagnosis present

## 2014-03-04 LAB — CYTOLOGY - PAP

## 2014-03-31 ENCOUNTER — Other Ambulatory Visit: Payer: Self-pay | Admitting: Family Medicine

## 2014-04-02 ENCOUNTER — Other Ambulatory Visit: Payer: Self-pay

## 2014-04-02 DIAGNOSIS — Z1231 Encounter for screening mammogram for malignant neoplasm of breast: Secondary | ICD-10-CM

## 2014-04-09 ENCOUNTER — Encounter: Payer: Self-pay | Admitting: Family Medicine

## 2014-04-09 ENCOUNTER — Ambulatory Visit (INDEPENDENT_AMBULATORY_CARE_PROVIDER_SITE_OTHER): Payer: BC Managed Care – PPO | Admitting: Family Medicine

## 2014-04-09 VITALS — BP 150/84 | HR 69 | Temp 98.8°F | Ht 66.5 in | Wt 182.0 lb

## 2014-04-09 DIAGNOSIS — S61219D Laceration without foreign body of unspecified finger without damage to nail, subsequent encounter: Secondary | ICD-10-CM

## 2014-04-09 NOTE — Progress Notes (Signed)
Pre visit review using our clinic review tool, if applicable. No additional management support is needed unless otherwise documented below in the visit note. 

## 2014-04-09 NOTE — Progress Notes (Signed)
   Subjective:    Patient ID: Melissa MillardJill Rinker, female    DOB: [birth year], 58 y.o.   MRN: [id number]  HPI Here to follow up a laceration of the left 3rd finger on 03-30-14. She cut the tip of the finger with a box cutter and she went to a [facility] facility. She had 3 sutures placed and has been dressing it with neosporin since then. The area is healing well but is still tender.    Review of Systems  Constitutional: Negative.   Skin: Positive for wound.       Objective:   Physical Exam  Constitutional: She appears well-developed and well-nourished.  Skin:  Small laceration on the tip of the left third finger, it is healing well          Assessment & Plan:  All sutures were removed. Recheck prn

## 2014-04-18 ENCOUNTER — Other Ambulatory Visit: Payer: Self-pay | Admitting: Family Medicine

## 2014-04-27 ENCOUNTER — Encounter: Payer: Self-pay | Admitting: Family Medicine

## 2014-04-27 ENCOUNTER — Encounter (HOSPITAL_COMMUNITY): Payer: Self-pay | Admitting: Emergency Medicine

## 2014-04-27 ENCOUNTER — Emergency Department (HOSPITAL_COMMUNITY)
Admission: EM | Admit: 2014-04-27 | Discharge: 2014-04-27 | Disposition: A | Discharge status: Home / Self Care | Payer: BC Managed Care – PPO | Attending: Emergency Medicine | Admitting: Emergency Medicine

## 2014-04-27 ENCOUNTER — Ambulatory Visit (INDEPENDENT_AMBULATORY_CARE_PROVIDER_SITE_OTHER): Payer: BC Managed Care – PPO | Admitting: Family Medicine

## 2014-04-27 ENCOUNTER — Emergency Department (HOSPITAL_COMMUNITY): Payer: BC Managed Care – PPO

## 2014-04-27 VITALS — BP 160/97 | HR 109

## 2014-04-27 DIAGNOSIS — R011 Cardiac murmur, unspecified: Secondary | ICD-10-CM | POA: Diagnosis not present

## 2014-04-27 DIAGNOSIS — R079 Chest pain, unspecified: Secondary | ICD-10-CM | POA: Insufficient documentation

## 2014-04-27 DIAGNOSIS — E785 Hyperlipidemia, unspecified: Secondary | ICD-10-CM | POA: Diagnosis not present

## 2014-04-27 DIAGNOSIS — Z86010 Personal history of colonic polyps: Secondary | ICD-10-CM | POA: Diagnosis not present

## 2014-04-27 DIAGNOSIS — R002 Palpitations: Secondary | ICD-10-CM | POA: Insufficient documentation

## 2014-04-27 DIAGNOSIS — Z8739 Personal history of other diseases of the musculoskeletal system and connective tissue: Secondary | ICD-10-CM | POA: Diagnosis not present

## 2014-04-27 DIAGNOSIS — I10 Essential (primary) hypertension: Secondary | ICD-10-CM | POA: Diagnosis not present

## 2014-04-27 DIAGNOSIS — Z7952 Long term (current) use of systemic steroids: Secondary | ICD-10-CM | POA: Insufficient documentation

## 2014-04-27 DIAGNOSIS — Z79899 Other long term (current) drug therapy: Secondary | ICD-10-CM | POA: Insufficient documentation

## 2014-04-27 LAB — CBC
HEMATOCRIT: 39.9 % (ref 36.0–46.0)
Hemoglobin: 13.5 g/dL (ref 12.0–15.0)
MCH: 29.9 pg (ref 26.0–34.0)
MCHC: 33.8 g/dL (ref 30.0–36.0)
MCV: 88.5 fL (ref 78.0–100.0)
Platelets: 253 10*3/uL (ref 150–400)
RBC: 4.51 MIL/uL (ref 3.87–5.11)
RDW: 13.6 % (ref 11.5–15.5)
WBC: 6.6 10*3/uL (ref 4.0–10.5)

## 2014-04-27 LAB — BASIC METABOLIC PANEL
Anion gap: 14 (ref 5–15)
BUN: 13 mg/dL (ref 6–23)
CALCIUM: 9.6 mg/dL (ref 8.4–10.5)
CO2: 25 mEq/L (ref 19–32)
Chloride: 100 mEq/L (ref 96–112)
Creatinine, Ser: 0.7 mg/dL (ref 0.50–1.10)
GFR calc Af Amer: >90 mL/min (ref >90)
GLUCOSE: 114 mg/dL — AB (ref 70–99)
Potassium: 4.5 mEq/L (ref 3.7–5.3)
SODIUM: 139 meq/L (ref 137–147)

## 2014-04-27 LAB — TSH: TSH: 0.807 u[IU]/mL (ref 0.350–4.500)

## 2014-04-27 LAB — I-STAT TROPONIN, ED: Troponin i, poc: 0 ng/mL (ref 0.00–0.08)

## 2014-04-27 NOTE — Discharge Instructions (Signed)
Your tests have been normal - please return to the ER if you have recurrent symptoms - read the attached list of precautions.  Please call your doctor for a followup appointment within 24-48 hours. When you talk to your doctor please let them know that you were seen in the emergency department and have them acquire all of your records so that they can discuss the findings with you and formulate a treatment plan to fully care for your new and ongoing problems.

## 2014-04-27 NOTE — ED Provider Notes (Signed)
CSN: [id number]     Arrival date & time 04/27/14  1345 History   First MD Initiated Contact with Patient 04/27/14 1347     Chief Complaint  Patient presents with  . Palpitations     (Consider location/radiation/quality/duration/timing/severity/associated sxs/prior Treatment) HPI Comments: The patient is a 58 year old female, she has a history of hypertension and hyperlipidemia. She currently takes hydrochlorothiazide and an angiotensin receptor blocker for her high blood pressure. She reports that this morning when she was walking in a store she developed acute onset of palpitations. She felt a heaviness in her chest and felt like her heart was racing. She became lightheaded and felt her hands go cold, this lasted approximately 30 minutes and then resolved gradually when she went to the doctor's office. In the doctor's office she was told that her heart rate was a little bit elevated at 110 bpm. Her blood pressure was slightly elevated at 160 systolic and it was recommended that she come to the hospital. The patient denies any symptoms at this time, no chest pain, no shortness of breath, no peripheral edema and she has not had any fevers chills nausea vomiting diarrhea swelling of the legs back pain abdominal pain. She has had normal appetite, denies any headache or neck pain, has no history of heart disease and has never had a stress test or heart catheterization. She does report wearing a Holter monitor many years ago when she had similar palpitations. Nothing was found. The patient denies any over-the-counter medication use, she does endorse having a several pound weight loss over the last couple of months especially after Thanksgiving which she thought was unusual but in general has not been losing weight unexpectedly.  Patient is a 58 y.o. female presenting with palpitations. The history is provided by the patient.  Palpitations   Past Medical History  Diagnosis Date  . Hyperlipidemia   .  Hypertension   . Heart murmur   . Hx of colonic polyps   . Osteoarthritis    Past Surgical History  Procedure Laterality Date  . Knee arthroscopy and arthrotomy  2007    left knee  . Colonoscopy  2009    repeat in 5 yrs  . Tubal ligation     Family History  Problem Relation Age of Onset  . Alcohol abuse    . Arthritis    . Coronary artery disease    . Diabetes    . Lung cancer     History  Substance Use Topics  . Smoking status: Never Smoker   . Smokeless tobacco: Never Used  . Alcohol Use: 1.2 oz/week    2 Not specified per week   OB History    No data available     Review of Systems  Cardiovascular: Positive for palpitations.  All other systems reviewed and are negative.     Allergies  Sulfonamide derivatives  Home Medications   Prior to Admission medications   Medication Sig Start Date End Date Taking? Authorizing Provider  b complex vitamins tablet Take 1 tablet by mouth daily.      Historical Provider, MD  Calcium 1500 MG tablet Take 1,500 mg by mouth.      Historical Provider, MD  ibuprofen (ADVIL,MOTRIN) 200 MG tablet Take 200 mg by mouth as needed.    Historical Provider, MD  losartan-hydrochlorothiazide (HYZAAR) 100-12.5 MG per tablet TAKE 1 TABLET BY MOUTH EVERY DAY 04/19/14   Nelwyn SalisburyStephen A Fry, MD  simvastatin (ZOCOR) 40 MG tablet TAKE 1 TABLET  BY MOUTH EVERY NIGHT AT BEDTIME 04/02/14   Nelwyn SalisburyStephen A Fry, MD  triamcinolone cream (KENALOG) 0.1 % Apply topically as needed. 02/26/12   Nelwyn SalisburyStephen A Fry, MD   BP 108/60 mmHg  Pulse 72  Temp(Src) 98.5 F (36.9 C) (Oral)  Resp 20  SpO2 97% Physical Exam  Constitutional: She appears well-developed and well-nourished. No distress.  HENT:  Head: Normocephalic and atraumatic.  Mouth/Throat: Oropharynx is clear and moist. No oropharyngeal exudate.  Eyes: Conjunctivae and EOM are normal. Pupils are equal, round, and reactive to light. Right eye exhibits no discharge. Left eye exhibits no discharge. No scleral icterus.   Neck: Normal range of motion. Neck supple. No JVD present. No thyromegaly present.  Cardiovascular: Normal rate, regular rhythm, normal heart sounds and intact distal pulses.  Exam reveals no gallop and no friction rub.   No murmur heard. Pulmonary/Chest: Effort normal and breath sounds normal. No respiratory distress. She has no wheezes. She has no rales.  Abdominal: Soft. Bowel sounds are normal. She exhibits no distension and no mass. There is no tenderness.  Musculoskeletal: Normal range of motion. She exhibits no edema or tenderness.  Lymphadenopathy:    She has no cervical adenopathy.  Neurological: She is alert. Coordination normal.  Skin: Skin is warm and dry. No rash noted. No erythema.  Psychiatric: She has a normal mood and affect. Her behavior is normal.  Nursing note and vitals reviewed.   ED Course  Procedures (including critical care time) Labs Review Labs Reviewed  BASIC METABOLIC PANEL - Abnormal; Notable for the following:    Glucose, Bld 114 (*)    All other components within normal limits  CBC  TSH  I-STAT TROPOININ, ED    Imaging Review No results found.   EKG Interpretation   Date/Time:  Tuesday April 27 2014 13:46:58 EST Ventricular Rate:  79 PR Interval:  168 QRS Duration: 87 QT Interval:  379 QTC Calculation: 434 R Axis:   67 Text Interpretation:  Sinus rhythm Probable left atrial enlargement ECG  OTHERWISE WITHIN NORMAL LIMITS No old tracing to compare Confirmed by  Deandria Klute  MD, Danaiya Steadman ([id number]) on 04/27/2014 2:00:00 PM      MDM   Final diagnoses:  Chest pain  Palpitations    The patient's exam is totally normal, her blood pressure is mildly elevated, I will start a workup for palpitations including thyroid function, electrolytes, cardiac monitoring. She has no pulmonary symptoms, denies any exertional symptoms in the past, this makes obstructive coronary disease much less likely.  Labs unremarkable, the patient has had no recurrent  arrhythmias or palpitations and has no chest pain. I have discussed this at length with the patient, she will follow-up in the outpatient setting, no indication for treatment with a beta blocker or rate control at this time. The patient is agreeable to the plan.    Vida RollerBrian D Aprel Egelhoff, MD 04/27/14 [phone]

## 2014-04-27 NOTE — ED Notes (Signed)
Per EMS: patient was working at [employer] and walking when she experienced SOB and "heart racing".  Pt went to car and drank some water.  Symptoms did not subside.  Pt went to local urgent care where EMS was called for transport to larger facility.  Pt denies any current symptoms, sts she "feels much better".  EKG normal/per EMS

## 2014-04-27 NOTE — ED Notes (Signed)
I gave the patient a cup of ice water per Dr. Hyacinth MeekerMiller.

## 2014-04-27 NOTE — Progress Notes (Signed)
   Subjective:    Patient ID: Melissa Bryant, female    DOB: Jun 02, 1955, 58 y.o.   MRN: [id number]  HPI Patient is here with c/o heart palpitations and feeling like her chest was beating out of her chest and she felt weak and dizzy and came to the clinic and she is a new patient and has never been seen here.   Review of Systems  C/o heart palpitations, chest pain, dizziness No  SOB, HA, vision change, N/V, diarrhea, constipation, dysuria, urinary urgency or frequency, myalgias, arthralgias or rash.     Objective:   Physical Exam Vital signs noted  Well developed well nourished female.  HEENT - Head atraumatic Normocephalic                Eyes - PERRLA, Conjuctiva - clear Sclera- Clear EOMI                Ears - EAC's Wnl TM's Wnl Gross Hearing WNL                Throat - oropharanx wnl Respiratory - Lungs CTA bilateral Cardiac - RRR S1 and S2 without murmur GI - Abdomen soft Nontender and bowel sounds active x 4   EKG - NSR without acute ST-T changes with occasional PVC's       Assessment & Plan:  Chest pain, unspecified chest pain type - Plan: EKG 12-Lead Recommend patient be seen by ED and recommend transport via EMS and patient agrees  Deatra CanterWilliam J Saket Hellstrom FNP

## 2014-05-03 ENCOUNTER — Ambulatory Visit
Admission: RE | Admit: 2014-05-03 | Discharge: 2014-05-03 | Disposition: A | Discharge status: Home / Self Care | Payer: BC Managed Care – PPO | Source: Ambulatory Visit

## 2014-05-03 DIAGNOSIS — Z1231 Encounter for screening mammogram for malignant neoplasm of breast: Secondary | ICD-10-CM

## 2014-05-04 ENCOUNTER — Encounter: Payer: Self-pay | Admitting: Family Medicine

## 2014-05-04 ENCOUNTER — Ambulatory Visit (INDEPENDENT_AMBULATORY_CARE_PROVIDER_SITE_OTHER): Payer: BC Managed Care – PPO | Admitting: Family Medicine

## 2014-05-04 VITALS — BP 161/88 | HR 86 | Temp 98.3°F | Ht 66.5 in | Wt 183.0 lb

## 2014-05-04 DIAGNOSIS — R002 Palpitations: Secondary | ICD-10-CM

## 2014-05-04 DIAGNOSIS — I10 Essential (primary) hypertension: Secondary | ICD-10-CM

## 2014-05-04 MED ORDER — METOPROLOL SUCCINATE ER 50 MG PO TB24: 50.0000 mg | ORAL_TABLET | Freq: Every day | ORAL | Status: DC

## 2014-05-04 NOTE — Progress Notes (Signed)
Pre visit review using our clinic review tool, if applicable. No additional management support is needed unless otherwise documented below in the visit note. 

## 2014-05-04 NOTE — Progress Notes (Signed)
   Subjective:    Patient ID: Melissa Bryant, female    DOB: 08-08-1955, 58 y.o.   MRN: [id number]  HPI Here to follow up an ED visit on 04-27-14 for palpitations. These occurred several times over a span of several days, never lasting more than a few minutes. There was no SOB or chest pain. These were not associated with exertion. She remembers having some coffee each of these mornings, and she usually does not drink caffeinated beverages. During the visit she had normal labs, CXR, and EKGs. She was sent home to follow up with us. She has not had any palpitations since then and she has felt fine.    Review of Systems  Constitutional: Negative.   Respiratory: Negative.   Cardiovascular: Positive for palpitations. Negative for chest pain and leg swelling.  Gastrointestinal: Negative.   Endocrine: Negative.        Objective:   Physical Exam  Constitutional: She is oriented to person, place, and time. She appears well-developed and well-nourished.  Neck: No thyromegaly present.  Cardiovascular: Normal rate, regular rhythm, normal heart sounds and intact distal pulses.   No murmur heard. Pulmonary/Chest: Effort normal and breath sounds normal.  Lymphadenopathy:    She has no cervical adenopathy.  Neurological: She is alert and oriented to person, place, and time.          Assessment & Plan:  These sound fairly benign and were probably the result of drinking caffeine. She will avoid this in the future. Also we will switch from Losartan HCT to Metoprolol succinate 50 mg daily. Recheck at her cpx here in 3 weeks.

## 2014-05-05 DIAGNOSIS — R002 Palpitations: Secondary | ICD-10-CM | POA: Insufficient documentation

## 2014-05-06 ENCOUNTER — Telehealth: Payer: Self-pay | Admitting: Family Medicine

## 2014-05-06 NOTE — Telephone Encounter (Signed)
emmi mailed  °

## 2014-05-11 ENCOUNTER — Other Ambulatory Visit: Payer: BC Managed Care – PPO

## 2014-05-13 ENCOUNTER — Other Ambulatory Visit (INDEPENDENT_AMBULATORY_CARE_PROVIDER_SITE_OTHER): Payer: BC Managed Care – PPO

## 2014-05-13 DIAGNOSIS — Z0000 Encounter for general adult medical examination without abnormal findings: Secondary | ICD-10-CM

## 2014-05-13 LAB — COMPREHENSIVE METABOLIC PANEL
ALBUMIN: 4.3 g/dL (ref 3.5–5.2)
ALT: 29 U/L (ref 0–35)
AST: 26 U/L (ref 0–37)
Alkaline Phosphatase: 76 U/L (ref 39–117)
BUN: 10 mg/dL (ref 6–23)
CO2: 24 meq/L (ref 19–32)
Calcium: 9.3 mg/dL (ref 8.4–10.5)
Chloride: 108 mEq/L (ref 96–112)
Creatinine, Ser: 0.8 mg/dL (ref 0.4–1.2)
GFR: 84.14 mL/min (ref >60.00)
Glucose, Bld: 93 mg/dL (ref 70–99)
POTASSIUM: 3.8 meq/L (ref 3.5–5.1)
SODIUM: 141 meq/L (ref 135–145)
TOTAL PROTEIN: 6.8 g/dL (ref 6.0–8.3)
Total Bilirubin: 0.6 mg/dL (ref 0.2–1.2)

## 2014-05-13 LAB — CBC WITH DIFFERENTIAL/PLATELET
BASOS ABS: 0 10*3/uL (ref 0.0–0.1)
Basophils Relative: 0.6 % (ref 0.0–3.0)
Eosinophils Absolute: 0.1 10*3/uL (ref 0.0–0.7)
Eosinophils Relative: 2.3 % (ref 0.0–5.0)
HCT: 40.6 % (ref 36.0–46.0)
HEMOGLOBIN: 13.4 g/dL (ref 12.0–15.0)
Lymphocytes Relative: 27.7 % (ref 12.0–46.0)
Lymphs Abs: 1.4 10*3/uL (ref 0.7–4.0)
MCHC: 33 g/dL (ref 30.0–36.0)
MCV: 90.3 fl (ref 78.0–100.0)
Monocytes Absolute: 0.5 10*3/uL (ref 0.1–1.0)
Monocytes Relative: 9.9 % (ref 3.0–12.0)
Neutro Abs: 3 10*3/uL (ref 1.4–7.7)
Neutrophils Relative %: 59.5 % (ref 43.0–77.0)
Platelets: 241 10*3/uL (ref 150.0–400.0)
RBC: 4.5 Mil/uL (ref 3.87–5.11)
RDW: 13.9 % (ref 11.5–15.5)
WBC: 5 10*3/uL (ref 4.0–10.5)

## 2014-05-13 LAB — LIPID PANEL
CHOLESTEROL: 144 mg/dL (ref 0–200)
HDL: 46.2 mg/dL (ref >39.00)
LDL CALC: 80 mg/dL (ref 0–99)
NONHDL: 97.8
Total CHOL/HDL Ratio: 3
Triglycerides: 87 mg/dL (ref 0.0–149.0)
VLDL: 17.4 mg/dL (ref 0.0–40.0)

## 2014-05-13 LAB — POCT URINALYSIS DIPSTICK
Bilirubin, UA: NEGATIVE
Blood, UA: NEGATIVE
Glucose, UA: NEGATIVE
KETONES UA: NEGATIVE
NITRITE UA: NEGATIVE
PH UA: 7
Protein, UA: NEGATIVE
Spec Grav, UA: 1.015
Urobilinogen, UA: 0.2

## 2014-05-13 LAB — TSH: TSH: 1.26 u[IU]/mL (ref 0.35–4.50)

## 2014-05-26 ENCOUNTER — Encounter: Payer: Self-pay | Admitting: Family Medicine

## 2014-05-26 ENCOUNTER — Ambulatory Visit (INDEPENDENT_AMBULATORY_CARE_PROVIDER_SITE_OTHER): Payer: BC Managed Care – PPO | Admitting: Family Medicine

## 2014-05-26 VITALS — BP 154/87 | HR 71 | Temp 98.8°F | Ht 66.5 in | Wt 181.0 lb

## 2014-05-26 DIAGNOSIS — Z0000 Encounter for general adult medical examination without abnormal findings: Secondary | ICD-10-CM

## 2014-05-26 DIAGNOSIS — J209 Acute bronchitis, unspecified: Secondary | ICD-10-CM

## 2014-05-26 MED ORDER — CEFPROZIL 500 MG PO TABS: 500.0000 mg | ORAL_TABLET | Freq: Two times a day (BID) | ORAL | Status: DC

## 2014-05-26 MED ORDER — SIMVASTATIN 40 MG PO TABS: 40.0000 mg | ORAL_TABLET | Freq: Every day | ORAL | Status: DC

## 2014-05-26 MED ORDER — METOPROLOL SUCCINATE ER 50 MG PO TB24: 50.0000 mg | ORAL_TABLET | Freq: Every day | ORAL | Status: DC

## 2014-05-26 NOTE — Progress Notes (Signed)
   Subjective:    Patient ID: Melissa MillardJill Varelas, female    DOB: [birth year], 58 y.o.   MRN: [id number]  HPI 58 yr old female for a cpx. Her main complaint today is 10 days of chest congestion and coughing up green sputum. No fever. We saw her 3 weeks ago for palpitations and we switched her HTN med to Metoprolol. This has worked well for her, her BP is stable and she has had no more palpitations at all. She is past due for a colonoscopy   Review of Systems  Constitutional: Negative.   HENT: Negative.   Eyes: Negative.   Respiratory: Positive for cough and chest tightness. Negative for apnea, shortness of breath, wheezing and stridor.   Cardiovascular: Negative.   Gastrointestinal: Negative.   Genitourinary: Negative for dysuria, urgency, frequency, hematuria, flank pain, decreased urine volume, enuresis, difficulty urinating, pelvic pain and dyspareunia.  Musculoskeletal: Negative.   Skin: Negative.   Neurological: Negative.   Psychiatric/Behavioral: Negative.        Objective:   Physical Exam  Constitutional: She is oriented to person, place, and time. She appears well-developed and well-nourished. No distress.  HENT:  Head: Normocephalic and atraumatic.  Right Ear: External ear normal.  Left Ear: External ear normal.  Nose: Nose normal.  Mouth/Throat: Oropharynx is clear and moist. No oropharyngeal exudate.  Eyes: Conjunctivae and EOM are normal. Pupils are equal, round, and reactive to light. No scleral icterus.  Neck: Normal range of motion. Neck supple. No JVD present. No thyromegaly present.  Cardiovascular: Normal rate, regular rhythm, normal heart sounds and intact distal pulses.  Exam reveals no gallop and no friction rub.   No murmur heard. Pulmonary/Chest: Effort normal and breath sounds normal. No respiratory distress. She has no wheezes. She has no rales. She exhibits no tenderness.  Abdominal: Soft. Bowel sounds are normal. She exhibits no distension and no mass. There is  no tenderness. There is no rebound and no guarding.  Musculoskeletal: Normal range of motion. She exhibits no edema or tenderness.  Lymphadenopathy:    She has no cervical adenopathy.  Neurological: She is alert and oriented to person, place, and time. She has normal reflexes. No cranial nerve deficit. She exhibits normal muscle tone. Coordination normal.  Skin: Skin is warm and dry. No rash noted. No erythema.  Psychiatric: She has a normal mood and affect. Her behavior is normal. Judgment and thought content normal.          Assessment & Plan:  Well exam. Set up another colonoscopy. Treat the bronchitis with Cefzil. Stay on Metoprolol.

## 2014-05-26 NOTE — Progress Notes (Signed)
Pre visit review using our clinic review tool, if applicable. No additional management support is needed unless otherwise documented below in the visit note. 

## 2015-03-08 ENCOUNTER — Other Ambulatory Visit: Payer: Self-pay | Admitting: Obstetrics and Gynecology

## 2015-03-08 ENCOUNTER — Other Ambulatory Visit (HOSPITAL_COMMUNITY)
Admission: RE | Admit: 2015-03-08 | Discharge: 2015-03-08 | Disposition: A | Discharge status: Home / Self Care | Payer: BLUE CROSS/BLUE SHIELD | Source: Ambulatory Visit | Attending: Obstetrics and Gynecology | Admitting: Obstetrics and Gynecology

## 2015-03-08 DIAGNOSIS — Z01419 Encounter for gynecological examination (general) (routine) without abnormal findings: Secondary | ICD-10-CM | POA: Insufficient documentation

## 2015-03-11 LAB — CYTOLOGY - PAP

## 2015-04-07 ENCOUNTER — Other Ambulatory Visit: Payer: Self-pay

## 2015-04-07 DIAGNOSIS — Z1231 Encounter for screening mammogram for malignant neoplasm of breast: Secondary | ICD-10-CM

## 2015-04-13 ENCOUNTER — Encounter: Payer: Self-pay | Admitting: Podiatry

## 2015-04-13 ENCOUNTER — Ambulatory Visit: Payer: BLUE CROSS/BLUE SHIELD

## 2015-04-13 ENCOUNTER — Ambulatory Visit (INDEPENDENT_AMBULATORY_CARE_PROVIDER_SITE_OTHER): Payer: BLUE CROSS/BLUE SHIELD | Admitting: Podiatry

## 2015-04-13 ENCOUNTER — Ambulatory Visit (INDEPENDENT_AMBULATORY_CARE_PROVIDER_SITE_OTHER): Payer: BLUE CROSS/BLUE SHIELD

## 2015-04-13 VITALS — BP 144/80 | HR 77 | Resp 16 | Ht 67.0 in | Wt 185.0 lb

## 2015-04-13 DIAGNOSIS — M21619 Bunion of unspecified foot: Secondary | ICD-10-CM

## 2015-04-13 DIAGNOSIS — M779 Enthesopathy, unspecified: Secondary | ICD-10-CM | POA: Diagnosis not present

## 2015-04-13 MED ORDER — TRIAMCINOLONE ACETONIDE 10 MG/ML IJ SUSP
10.0000 mg | Freq: Once | INTRAMUSCULAR | Status: AC
  Administered 2015-04-13: 10 mg

## 2015-04-13 NOTE — Progress Notes (Signed)
   Subjective:    Patient ID: Melissa Bryant, female    DOB: [birth year], 59 y.o.   MRN: [id number]  HPI Patient presents with bilateral foot pain, bunions. Pt stated, "left foot hurts more than right foot". Pain has gotten worse over the past 3 months.   Review of Systems  All other systems reviewed and are negative.      Objective:   Physical Exam        Assessment & Plan:

## 2015-04-13 NOTE — Progress Notes (Signed)
Subjective:     Patient ID: Melissa MillardJill Bryant, female   DOB: 25-Nov-1955, 59 y.o.   MRN: [id number]  HPI patient states I am developing a painful bunion on my left foot and it started a while ago and is worsened over the last few months. I been trying wider shoes without relief and I did have my hammertoes fixed in the past   Review of Systems  All other systems reviewed and are negative.      Objective:   Physical Exam  Constitutional: She is oriented to person, place, and time.  Cardiovascular: Intact distal pulses.   Musculoskeletal: Normal range of motion.  Neurological: She is oriented to person, place, and time.  Skin: Skin is warm.  Nursing note and vitals reviewed.  neurovascular status is intact muscle strength is adequate range of motion within normal limits. Patient's noted to have hyperostosis with inflammation around the first MPJ left over right with fluid buildup around the first MPJ left medial side. 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 Today I went ahead and I injected the joint surface 3 mg Kenalog 5 mg Xylocaine advised on wider-type shoes and we'll reevaluate again in 1 month and ultimately may require surgical correction of deformity as symptoms indicate

## 2015-05-05 ENCOUNTER — Ambulatory Visit
Admission: RE | Admit: 2015-05-05 | Discharge: 2015-05-05 | Disposition: A | Discharge status: Home / Self Care | Payer: BLUE CROSS/BLUE SHIELD | Source: Ambulatory Visit

## 2015-05-05 DIAGNOSIS — Z1231 Encounter for screening mammogram for malignant neoplasm of breast: Secondary | ICD-10-CM

## 2015-05-10 ENCOUNTER — Other Ambulatory Visit (INDEPENDENT_AMBULATORY_CARE_PROVIDER_SITE_OTHER): Payer: BLUE CROSS/BLUE SHIELD

## 2015-05-10 DIAGNOSIS — Z0000 Encounter for general adult medical examination without abnormal findings: Secondary | ICD-10-CM | POA: Diagnosis not present

## 2015-05-10 LAB — TSH: TSH: 1.51 u[IU]/mL (ref 0.35–4.50)

## 2015-05-10 LAB — CBC WITH DIFFERENTIAL/PLATELET
Basophils Absolute: 0 10*3/uL (ref 0.0–0.1)
Basophils Relative: 0.5 % (ref 0.0–3.0)
Eosinophils Absolute: 0.1 10*3/uL (ref 0.0–0.7)
Eosinophils Relative: 3.1 % (ref 0.0–5.0)
HCT: 41 % (ref 36.0–46.0)
Hemoglobin: 13.7 g/dL (ref 12.0–15.0)
LYMPHS ABS: 1.4 10*3/uL (ref 0.7–4.0)
Lymphocytes Relative: 30.3 % (ref 12.0–46.0)
MCHC: 33.3 g/dL (ref 30.0–36.0)
MCV: 90.3 fl (ref 78.0–100.0)
MONO ABS: 0.4 10*3/uL (ref 0.1–1.0)
MONOS PCT: 9.8 % (ref 3.0–12.0)
NEUTROS PCT: 56.3 % (ref 43.0–77.0)
Neutro Abs: 2.5 10*3/uL (ref 1.4–7.7)
Platelets: 241 10*3/uL (ref 150.0–400.0)
RBC: 4.55 Mil/uL (ref 3.87–5.11)
RDW: 14.2 % (ref 11.5–15.5)
WBC: 4.5 10*3/uL (ref 4.0–10.5)

## 2015-05-10 LAB — BASIC METABOLIC PANEL
BUN: 13 mg/dL (ref 6–23)
CO2: 29 mEq/L (ref 19–32)
Calcium: 9.5 mg/dL (ref 8.4–10.5)
Chloride: 107 mEq/L (ref 96–112)
Creatinine, Ser: 0.83 mg/dL (ref 0.40–1.20)
GFR: 74.6 mL/min (ref >60.00)
Glucose, Bld: 104 mg/dL — ABNORMAL HIGH (ref 70–99)
POTASSIUM: 4.6 meq/L (ref 3.5–5.1)
SODIUM: 142 meq/L (ref 135–145)

## 2015-05-10 LAB — POCT URINALYSIS DIPSTICK
Bilirubin, UA: NEGATIVE
Blood, UA: NEGATIVE
GLUCOSE UA: NEGATIVE
KETONES UA: NEGATIVE
NITRITE UA: NEGATIVE
Protein, UA: NEGATIVE
Spec Grav, UA: 1.02
Urobilinogen, UA: 0.2
pH, UA: 7

## 2015-05-10 LAB — HEPATIC FUNCTION PANEL
ALK PHOS: 87 U/L (ref 39–117)
ALT: 27 U/L (ref 0–35)
AST: 26 U/L (ref 0–37)
Albumin: 4.3 g/dL (ref 3.5–5.2)
BILIRUBIN TOTAL: 0.6 mg/dL (ref 0.2–1.2)
Bilirubin, Direct: 0.2 mg/dL (ref 0.0–0.3)
Total Protein: 6.8 g/dL (ref 6.0–8.3)

## 2015-05-10 LAB — LIPID PANEL
CHOL/HDL RATIO: 3
Cholesterol: 155 mg/dL (ref 0–200)
HDL: 57.8 mg/dL (ref >39.00)
LDL Cholesterol: 79 mg/dL (ref 0–99)
NonHDL: 97.3
TRIGLYCERIDES: 94 mg/dL (ref 0.0–149.0)
VLDL: 18.8 mg/dL (ref 0.0–40.0)

## 2015-05-11 ENCOUNTER — Encounter: Payer: Self-pay | Admitting: Podiatry

## 2015-05-11 ENCOUNTER — Telehealth: Payer: Self-pay | Admitting: Family Medicine

## 2015-05-11 ENCOUNTER — Encounter: Payer: BLUE CROSS/BLUE SHIELD | Admitting: Podiatry

## 2015-05-11 NOTE — Telephone Encounter (Signed)
Pt has been reschedule to 06/08/15 ok per sylvia

## 2015-05-11 NOTE — Telephone Encounter (Signed)
Ok and I spoke with scheduling.

## 2015-05-11 NOTE — Telephone Encounter (Signed)
Pt last cpx on 05-26-14 and pt is sch for 05-17-15 and cpx needs to be reschedule. Can I use sda slot to create 30 min appt on 05-27-15?

## 2015-05-17 ENCOUNTER — Encounter: Payer: Self-pay | Admitting: Family Medicine

## 2015-05-19 ENCOUNTER — Other Ambulatory Visit: Payer: Self-pay | Admitting: Family Medicine

## 2015-05-26 ENCOUNTER — Ambulatory Visit (INDEPENDENT_AMBULATORY_CARE_PROVIDER_SITE_OTHER): Payer: BLUE CROSS/BLUE SHIELD | Admitting: Podiatry

## 2015-05-26 ENCOUNTER — Encounter: Payer: Self-pay | Admitting: Podiatry

## 2015-05-26 DIAGNOSIS — M21619 Bunion of unspecified foot: Secondary | ICD-10-CM

## 2015-05-26 NOTE — Patient Instructions (Signed)
Pre-Operative Instructions  Congratulations, you have decided to take an important step to improving your quality of life.  You can be assured that the doctors of [facility] will be with you every step of the way.  1. Plan to be at the surgery center/hospital at least 1 (one) hour prior to your scheduled time unless otherwise directed by the surgical center/hospital staff.  You must have a responsible adult accompany you, remain during the surgery and drive you home.  Make sure you have directions to the surgical center/hospital and know how to get there on time. 2. For hospital based surgery you will need to obtain a history and physical form from your family physician within 1 month prior to the date of surgery- we will give you a form for you primary physician.  3. We make every effort to accommodate the date you request for surgery.  There are however, times where surgery dates or times have to be moved.  We will contact you as soon as possible if a change in schedule is required.   4. No Aspirin/Ibuprofen for one week before surgery.  If you are on aspirin, any non-steroidal anti-inflammatory medications (Mobic, Aleve, Ibuprofen) you should stop taking it 7 days prior to your surgery.  You make take Tylenol  For pain prior to surgery.  5. Medications- If you are taking daily heart and blood pressure medications, seizure, reflux, allergy, asthma, anxiety, pain or diabetes medications, make sure the surgery center/hospital is aware before the day of surgery so they may notify you which medications to take or avoid the day of surgery. 6. No food or drink after midnight the night before surgery unless directed otherwise by surgical center/hospital staff. 7. No alcoholic beverages 24 hours prior to surgery.  No smoking 24 hours prior to or 24 hours after surgery. 8. Wear loose pants or shorts- loose enough to fit over bandages, boots, and casts. 9. No slip on shoes, sneakers are best. 10. Bring  your boot with you to the surgery center/hospital.  Also bring crutches or a walker if your physician has prescribed it for you.  If you do not have this equipment, it will be provided for you after surgery. 11. If you have not been contracted by the surgery center/hospital by the day before your surgery, call to confirm the date and time of your surgery. 12. Leave-time from work may vary depending on the type of surgery you have.  Appropriate arrangements should be made prior to surgery with your employer. 13. Prescriptions will be provided immediately following surgery by your doctor.  Have these filled as soon as possible after surgery and take the medication as directed. 14. Remove nail polish on the operative foot. 15. Wash the night before surgery.  The night before surgery wash the foot and leg well with the antibacterial soap provided and water paying special attention to beneath the toenails and in between the toes.  Rinse thoroughly with water and dry well with a towel.  Perform this wash unless told not to do so by your physician.  Enclosed: 1 Ice pack (please put in freezer the night before surgery)   1 Hibiclens skin cleaner   Pre-op Instructions  If you have any questions regarding the instructions, do not hesitate to call our office.  [place]: [address] [place], [place] [address] [phone]  [place]: [address] [place], [place] [address] [phone]  [place]: [address]  [place],  [address] [phone]  Dr. Richard   Tuchman DPM, Dr. Kiyana Vazguez DPM Dr. Richard Sikora DPM, Dr. M. Todd Hyatt DPM, Dr. Kathryn Egerton DPM 

## 2015-05-26 NOTE — Progress Notes (Signed)
Subjective:     Patient ID: Bertram MillardJill Zito, female   DOB: 08/04/1955, 59 y.o.   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 Patient wants surgery and I allowed her to read consent form reviewing alternative treatments and complications associated with this and she is willing to accept risk and signs consent form after extensive review understanding total recovery can be from 6 months to one year. Dispensed air fracture walker for the postoperative period and she will get used to it prior to surgery and is encouraged to call with questions and is scheduled for surgery in the next several weeks

## 2015-06-08 ENCOUNTER — Encounter: Payer: Self-pay | Admitting: Family Medicine

## 2015-06-08 ENCOUNTER — Ambulatory Visit (INDEPENDENT_AMBULATORY_CARE_PROVIDER_SITE_OTHER): Payer: BLUE CROSS/BLUE SHIELD | Admitting: Family Medicine

## 2015-06-08 VITALS — BP 168/86 | HR 75 | Temp 98.8°F | Ht 66.5 in | Wt 186.0 lb

## 2015-06-08 DIAGNOSIS — Z23 Encounter for immunization: Secondary | ICD-10-CM

## 2015-06-08 DIAGNOSIS — R739 Hyperglycemia, unspecified: Secondary | ICD-10-CM

## 2015-06-08 DIAGNOSIS — Z0000 Encounter for general adult medical examination without abnormal findings: Secondary | ICD-10-CM

## 2015-06-08 MED ORDER — METOPROLOL SUCCINATE ER 100 MG PO TB24: 100.0000 mg | ORAL_TABLET | Freq: Every day | ORAL | Status: DC

## 2015-06-08 MED ORDER — TRIAMCINOLONE ACETONIDE 0.1 % EX CREA: TOPICAL_CREAM | CUTANEOUS | Status: DC | PRN

## 2015-06-08 MED ORDER — SIMVASTATIN 40 MG PO TABS: 40.0000 mg | ORAL_TABLET | Freq: Every day | ORAL | Status: DC

## 2015-06-08 NOTE — Progress Notes (Signed)
   Subjective:    Patient ID: Melissa Bryant, female    DOB: 08/21/1955, 60 y.o.   MRN: [id number]  HPI 60 yr old female for a cpx. She feels well in general. She tries to watch what she eats but she does not exercise much.    Review of Systems  Constitutional: Negative.   HENT: Negative.   Eyes: Negative.   Respiratory: Negative.   Cardiovascular: Negative.   Gastrointestinal: Negative.   Genitourinary: Negative for dysuria, urgency, frequency, hematuria, flank pain, decreased urine volume, enuresis, difficulty urinating, pelvic pain and dyspareunia.  Musculoskeletal: Negative.   Skin: Negative.   Neurological: Negative.   Psychiatric/Behavioral: Negative.        Objective:   Physical Exam  Constitutional: She is oriented to person, place, and time. She appears well-developed and well-nourished. No distress.  HENT:  Head: Normocephalic and atraumatic.  Right Ear: External ear normal.  Left Ear: External ear normal.  Nose: Nose normal.  Mouth/Throat: Oropharynx is clear and moist. No oropharyngeal exudate.  Eyes: Conjunctivae and EOM are normal. Pupils are equal, round, and reactive to light. No scleral icterus.  Neck: Normal range of motion. Neck supple. No JVD present. No thyromegaly present.  Cardiovascular: Normal rate, regular rhythm, normal heart sounds and intact distal pulses.  Exam reveals no gallop and no friction rub.   No murmur heard. EKG normal   Pulmonary/Chest: Effort normal and breath sounds normal. No respiratory distress. She has no wheezes. She has no rales. She exhibits no tenderness.  Abdominal: Soft. Bowel sounds are normal. She exhibits no distension and no mass. There is no tenderness. There is no rebound and no guarding.  Musculoskeletal: Normal range of motion. She exhibits no edema or tenderness.  Lymphadenopathy:    She has no cervical adenopathy.  Neurological: She is alert and oriented to person, place, and time. She has normal reflexes. No  cranial nerve deficit. She exhibits normal muscle tone. Coordination normal.  Skin: Skin is warm and dry. No rash noted. No erythema.  Psychiatric: She has a normal mood and affect. Her behavior is normal. Judgment and thought content normal.          Assessment & Plan:  Well exam. We discussed diet and exercise. Her HTN is a little out of control so we will increase the Metoprolol succinate to 100 mg daily. She has had several elevated glucose levels so we will get a baseline A1c today.

## 2015-06-08 NOTE — Progress Notes (Signed)
Pre visit review using our clinic review tool, if applicable. No additional management support is needed unless otherwise documented below in the visit note. 

## 2015-06-09 LAB — HEMOGLOBIN A1C: HEMOGLOBIN A1C: 5.7 % (ref 4.6–6.5)

## 2015-06-27 ENCOUNTER — Telehealth: Payer: Self-pay | Admitting: *Deleted

## 2015-06-28 ENCOUNTER — Encounter: Payer: Self-pay | Admitting: Podiatry

## 2015-06-28 DIAGNOSIS — M2012 Hallux valgus (acquired), left foot: Secondary | ICD-10-CM | POA: Diagnosis not present

## 2015-06-28 NOTE — Telephone Encounter (Signed)
"  I'm scheduled to have surgery tomorrow on my Bunion with Dr. Charlsie Merles at the surgery center.  I called the surgery center this morning because I've had this cold for a week.  I been taking cold medicine and of course they have NSAIDS in them.  I spoke to Maralyn Sago and she wanted me to contact you to see if it's okay to go through with the surgery.  Give me a call."

## 2015-06-29 ENCOUNTER — Telehealth: Payer: Self-pay | Admitting: *Deleted

## 2015-06-29 NOTE — Telephone Encounter (Signed)
06/28/2015 - pt left message and was transferred to my line. 06/29/2015 - I SPOKE WITH PT, she states she removed the boot to sleep and the numbness is better.  I instructed pt to sleep in the boot, and walk in the boot, but I think one night without the boot was okay, and could even take the boot off if she was resting and not going to sleep.  Pt states understanding.

## 2015-06-29 NOTE — Telephone Encounter (Signed)
"  I had surgery today on my Bunion.  I just wanted to let you know, My toes have been numb since I came home.  I don't know if my bandage is too tight.  Please give me a call.  Thank you."   I'm returning your call from yesterday.  "I called yesterday and the day before."  I see that you spoke to [place].  "Did you receive a block?"  I don't know what that means.  "It's dealing with anesthesia, a block that can cause your leg to be numb."  Isn't that something you should know?"  Let me ask Dr. Charlsie Merles is you received one.  He stated you didn't.  The numbness is normal.  It should start to wear off.  "It has started to wear off."  Remember to elevate as much as possible.  How's your pain medication doing for you?  "I haven't taken any."  That's great.  Take it as needed.  Drink plenty of fluids.  If you get a fever of 101 or more give [place] a call.  "Well I think you all need a better system to reach someone.  We shouldn't have to press several prompts to reach someone.  It's ridiculous.  At least put an extension on the post-op sheet, that would help."  I'll pass that information on.

## 2015-07-08 ENCOUNTER — Ambulatory Visit (INDEPENDENT_AMBULATORY_CARE_PROVIDER_SITE_OTHER): Payer: BLUE CROSS/BLUE SHIELD

## 2015-07-08 ENCOUNTER — Ambulatory Visit (INDEPENDENT_AMBULATORY_CARE_PROVIDER_SITE_OTHER): Payer: BLUE CROSS/BLUE SHIELD | Admitting: Podiatry

## 2015-07-08 VITALS — Temp 97.9°F

## 2015-07-08 DIAGNOSIS — M21619 Bunion of unspecified foot: Secondary | ICD-10-CM

## 2015-07-08 DIAGNOSIS — Z9889 Other specified postprocedural states: Secondary | ICD-10-CM

## 2015-07-11 NOTE — Progress Notes (Signed)
Subjective:     Patient ID: Melissa Bryant, female   DOB: August 25, 1955, 60 y.o.   MRN: [id number]  HPI patient presents stating I'm doing very well with my left foot with minimal discomfort and swelling   Review of Systems     Objective:   Physical Exam Neurovascular status intact muscle strength adequate with good alignment of the first metatarsal left with wound edges well coapted hallux in rectus position and negative Homans sign noted    Assessment:     Doing well with post osteotomy correction left    Plan:     H&P x-ray reviewed and sterile dressing reapplied. Gave instructions on physical therapy continued immobilization elevation and will be seen back in the next 2-3 weeks or earlier if any issues should occur  X-ray report indicates good alignment of the first metatarsal with fixation in place and no indications of movement at the current time

## 2015-07-25 ENCOUNTER — Ambulatory Visit (INDEPENDENT_AMBULATORY_CARE_PROVIDER_SITE_OTHER): Payer: BLUE CROSS/BLUE SHIELD

## 2015-07-25 ENCOUNTER — Ambulatory Visit (INDEPENDENT_AMBULATORY_CARE_PROVIDER_SITE_OTHER): Payer: BLUE CROSS/BLUE SHIELD | Admitting: Podiatry

## 2015-07-25 ENCOUNTER — Encounter: Payer: Self-pay | Admitting: Podiatry

## 2015-07-25 VITALS — BP 140/75 | HR 73 | Resp 16

## 2015-07-25 DIAGNOSIS — Z9889 Other specified postprocedural states: Secondary | ICD-10-CM

## 2015-07-25 DIAGNOSIS — M21619 Bunion of unspecified foot: Secondary | ICD-10-CM

## 2015-07-26 NOTE — Progress Notes (Signed)
Subjective:     Patient ID: Melissa Bryant, female   DOB: [birth year], 60 y.o.   MRN: [id number]  HPI patient states I'm doing well with my left foot with minimal discomfort   Review of Systems     Objective:   Physical Exam Neurovascular status intact with well-healing surgical site left wound edges well coapted and good alignment    Assessment:     Doing well post osteotomy first metatarsal left    Plan:     X-ray reviewed indicating screws in good alignment with no problems. Patient may gradually return to soft shoes over the next few weeks  X-ray report indicate screws in good position and the joint is congruence

## 2015-08-05 NOTE — Progress Notes (Signed)
Patient ID: [name], female   DOB: January 29, 1956, 60 y.o.   MRN: [id number] Dr Charlsie Merlesegal performed an [name] Bunionectomy with pin fixation Lt foot [facility] 06/[facility]/15 [facility]

## 2015-08-26 NOTE — Progress Notes (Signed)
This encounter was created in error - please disregard.

## 2015-09-05 ENCOUNTER — Ambulatory Visit (INDEPENDENT_AMBULATORY_CARE_PROVIDER_SITE_OTHER): Payer: BLUE CROSS/BLUE SHIELD

## 2015-09-05 ENCOUNTER — Ambulatory Visit (INDEPENDENT_AMBULATORY_CARE_PROVIDER_SITE_OTHER): Payer: BLUE CROSS/BLUE SHIELD | Admitting: Podiatry

## 2015-09-05 ENCOUNTER — Encounter: Payer: Self-pay | Admitting: Podiatry

## 2015-09-05 VITALS — BP 148/77 | HR 57 | Resp 16

## 2015-09-05 DIAGNOSIS — Z9889 Other specified postprocedural states: Secondary | ICD-10-CM

## 2015-09-05 DIAGNOSIS — M21619 Bunion of unspecified foot: Secondary | ICD-10-CM | POA: Diagnosis not present

## 2015-09-05 DIAGNOSIS — M722 Plantar fascial fibromatosis: Secondary | ICD-10-CM

## 2015-09-05 NOTE — Patient Instructions (Signed)

## 2015-09-06 NOTE — Progress Notes (Signed)
Subjective:     Patient ID: Melissa Bryant, female   DOB: Jan 29, 1956, 60 y.o.   MRN: [id number]  HPI patient presents stating I'm doing very well with my big toe joint left   Review of Systems     Objective:   Physical Exam Neurovascular status intact muscle strength adequate with good range of motion first MPJ left with minimal discomfort in the joint good range of motion and no crepitus    Assessment:     Doing better [facility] about 10 weeks ago    Plan:     X-rays reviewed and patient to return to normal activities but no jumping. Reappoint 6 weeks or earlier if needed  X-ray report indicated pins are in place joint is open with good alignment of the first MPJ noted

## 2016-04-10 ENCOUNTER — Other Ambulatory Visit: Payer: Self-pay | Admitting: Family Medicine

## 2016-04-10 MED ORDER — METOPROLOL SUCCINATE ER 100 MG PO TB24: 100.0000 mg | ORAL_TABLET | Freq: Every day | ORAL | 1 refills | Status: DC

## 2016-04-10 MED ORDER — SIMVASTATIN 40 MG PO TABS: 40.0000 mg | ORAL_TABLET | Freq: Every day | ORAL | 1 refills | Status: DC

## 2016-04-10 NOTE — Telephone Encounter (Signed)
Rxs sent

## 2016-04-10 NOTE — Telephone Encounter (Signed)
Pt need new Rx for metoprolol succinate 100 mg  And simvastatin  Pharm: [facility] [place]

## 2016-04-11 ENCOUNTER — Other Ambulatory Visit: Payer: Self-pay | Admitting: Obstetrics and Gynecology

## 2016-04-11 ENCOUNTER — Other Ambulatory Visit (HOSPITAL_COMMUNITY)
Admission: RE | Admit: 2016-04-11 | Discharge: 2016-04-11 | Disposition: A | Discharge status: Home / Self Care | Payer: BLUE CROSS/BLUE SHIELD | Source: Ambulatory Visit | Attending: Obstetrics and Gynecology | Admitting: Obstetrics and Gynecology

## 2016-04-11 DIAGNOSIS — Z01419 Encounter for gynecological examination (general) (routine) without abnormal findings: Secondary | ICD-10-CM | POA: Insufficient documentation

## 2016-04-11 DIAGNOSIS — Z1151 Encounter for screening for human papillomavirus (HPV): Secondary | ICD-10-CM | POA: Diagnosis present

## 2016-04-16 LAB — CYTOLOGY - PAP
DIAGNOSIS: NEGATIVE
HPV: NOT DETECTED

## 2016-06-04 ENCOUNTER — Telehealth: Payer: Self-pay | Admitting: Family Medicine

## 2016-06-04 NOTE — Telephone Encounter (Signed)
error 

## 2016-06-26 ENCOUNTER — Other Ambulatory Visit (INDEPENDENT_AMBULATORY_CARE_PROVIDER_SITE_OTHER): Payer: BLUE CROSS/BLUE SHIELD

## 2016-06-26 DIAGNOSIS — Z0000 Encounter for general adult medical examination without abnormal findings: Secondary | ICD-10-CM | POA: Diagnosis not present

## 2016-06-26 LAB — POC URINALSYSI DIPSTICK (AUTOMATED)
Bilirubin, UA: NEGATIVE
Blood, UA: NEGATIVE
Glucose, UA: NEGATIVE
KETONES UA: NEGATIVE
Nitrite, UA: NEGATIVE
PH UA: 6.5
PROTEIN UA: NEGATIVE
SPEC GRAV UA: 1.025
UROBILINOGEN UA: 0.2

## 2016-06-26 LAB — BASIC METABOLIC PANEL
BUN: 12 mg/dL (ref 6–23)
CALCIUM: 9.5 mg/dL (ref 8.4–10.5)
CO2: 29 mEq/L (ref 19–32)
CREATININE: 0.81 mg/dL (ref 0.40–1.20)
Chloride: 107 mEq/L (ref 96–112)
GFR: 76.44 mL/min (ref >60.00)
Glucose, Bld: 110 mg/dL — ABNORMAL HIGH (ref 70–99)
Potassium: 4.4 mEq/L (ref 3.5–5.1)
Sodium: 142 mEq/L (ref 135–145)

## 2016-06-26 LAB — CBC WITH DIFFERENTIAL/PLATELET
BASOS PCT: 1 % (ref 0.0–3.0)
Basophils Absolute: 0 10*3/uL (ref 0.0–0.1)
EOS ABS: 0.1 10*3/uL (ref 0.0–0.7)
EOS PCT: 2.6 % (ref 0.0–5.0)
HCT: 39.5 % (ref 36.0–46.0)
HEMOGLOBIN: 13.5 g/dL (ref 12.0–15.0)
LYMPHS ABS: 1.6 10*3/uL (ref 0.7–4.0)
Lymphocytes Relative: 30.8 % (ref 12.0–46.0)
MCHC: 34.1 g/dL (ref 30.0–36.0)
MCV: 89.8 fl (ref 78.0–100.0)
MONO ABS: 0.5 10*3/uL (ref 0.1–1.0)
Monocytes Relative: 9.6 % (ref 3.0–12.0)
NEUTROS ABS: 2.9 10*3/uL (ref 1.4–7.7)
Neutrophils Relative %: 56 % (ref 43.0–77.0)
PLATELETS: 232 10*3/uL (ref 150.0–400.0)
RBC: 4.4 Mil/uL (ref 3.87–5.11)
RDW: 14 % (ref 11.5–15.5)
WBC: 5.2 10*3/uL (ref 4.0–10.5)

## 2016-06-26 LAB — HEPATIC FUNCTION PANEL
ALT: 16 U/L (ref 0–35)
AST: 18 U/L (ref 0–37)
Albumin: 4.4 g/dL (ref 3.5–5.2)
Alkaline Phosphatase: 84 U/L (ref 39–117)
BILIRUBIN DIRECT: 0.1 mg/dL (ref 0.0–0.3)
BILIRUBIN TOTAL: 0.5 mg/dL (ref 0.2–1.2)
Total Protein: 6.7 g/dL (ref 6.0–8.3)

## 2016-06-26 LAB — LIPID PANEL
CHOLESTEROL: 164 mg/dL (ref 0–200)
HDL: 50.3 mg/dL (ref >39.00)
LDL Cholesterol: 81 mg/dL (ref 0–99)
NONHDL: 114.11
Total CHOL/HDL Ratio: 3
Triglycerides: 164 mg/dL — ABNORMAL HIGH (ref 0.0–149.0)
VLDL: 32.8 mg/dL (ref 0.0–40.0)

## 2016-06-26 LAB — TSH: TSH: 2.42 u[IU]/mL (ref 0.35–4.50)

## 2016-07-03 ENCOUNTER — Ambulatory Visit (INDEPENDENT_AMBULATORY_CARE_PROVIDER_SITE_OTHER): Payer: BLUE CROSS/BLUE SHIELD | Admitting: Family Medicine

## 2016-07-03 ENCOUNTER — Encounter: Payer: Self-pay | Admitting: Family Medicine

## 2016-07-03 VITALS — BP 164/79 | HR 86 | Temp 98.3°F | Ht 66.5 in | Wt 187.0 lb

## 2016-07-03 DIAGNOSIS — Z0000 Encounter for general adult medical examination without abnormal findings: Secondary | ICD-10-CM

## 2016-07-03 MED ORDER — LOSARTAN POTASSIUM 50 MG PO TABS: 50.0000 mg | ORAL_TABLET | Freq: Every day | ORAL | 3 refills | Status: DC

## 2016-07-03 NOTE — Progress Notes (Signed)
Pre visit review using our clinic review tool, if applicable. No additional management support is needed unless otherwise documented below in the visit note. 

## 2016-07-03 NOTE — Progress Notes (Signed)
   Subjective:    Patient ID: Melissa Bryant, female    DOB: 1955/05/31, 61 y.o.   MRN: [id number]  HPI 61 yr old female for a well exam. She feels well.    Review of Systems  Constitutional: Negative.   HENT: Negative.   Eyes: Negative.   Respiratory: Negative.   Cardiovascular: Negative.   Gastrointestinal: Negative.   Genitourinary: Negative for decreased urine volume, difficulty urinating, dyspareunia, dysuria, enuresis, flank pain, frequency, hematuria, pelvic pain and urgency.  Musculoskeletal: Negative.   Skin: Negative.   Neurological: Negative.   Psychiatric/Behavioral: Negative.        Objective:   Physical Exam  Constitutional: She is oriented to person, place, and time. She appears well-developed and well-nourished. No distress.  HENT:  Head: Normocephalic and atraumatic.  Right Ear: External ear normal.  Left Ear: External ear normal.  Nose: Nose normal.  Mouth/Throat: Oropharynx is clear and moist. No oropharyngeal exudate.  Eyes: Conjunctivae and EOM are normal. Pupils are equal, round, and reactive to light. No scleral icterus.  Neck: Normal range of motion. Neck supple. No JVD present. No thyromegaly present.  Cardiovascular: Normal rate, regular rhythm, normal heart sounds and intact distal pulses.  Exam reveals no gallop and no friction rub.   No murmur heard. Pulmonary/Chest: Effort normal and breath sounds normal. No respiratory distress. She has no wheezes. She has no rales. She exhibits no tenderness.  Abdominal: Soft. Bowel sounds are normal. She exhibits no distension and no mass. There is no tenderness. There is no rebound and no guarding.  Musculoskeletal: Normal range of motion. She exhibits no edema or tenderness.  Lymphadenopathy:    She has no cervical adenopathy.  Neurological: She is alert and oriented to person, place, and time. She has normal reflexes. No cranial nerve deficit. She exhibits normal muscle tone. Coordination normal.  Skin: Skin  is warm and dry. No rash noted. No erythema.  Psychiatric: She has a normal mood and affect. Her behavior is normal. Judgment and thought content normal.          Assessment & Plan:  Well exam. We discussed diet and exercise. We will add Losartan 50 mg daily to her Metoprolol to get the BP down. Recheck in one month. Set up a colonoscopy.  Gershon CraneStephen Suellyn Meenan, MD

## 2016-08-06 DIAGNOSIS — Z86010 Personal history of colonic polyps: Secondary | ICD-10-CM | POA: Diagnosis not present

## 2016-08-06 DIAGNOSIS — K648 Other hemorrhoids: Secondary | ICD-10-CM | POA: Diagnosis not present

## 2016-08-06 DIAGNOSIS — K5730 Diverticulosis of large intestine without perforation or abscess without bleeding: Secondary | ICD-10-CM | POA: Diagnosis not present

## 2016-08-06 DIAGNOSIS — Z1211 Encounter for screening for malignant neoplasm of colon: Secondary | ICD-10-CM | POA: Diagnosis not present

## 2016-08-06 LAB — HM COLONOSCOPY

## 2016-08-07 ENCOUNTER — Encounter: Payer: Self-pay | Admitting: Family Medicine

## 2016-09-05 ENCOUNTER — Telehealth: Payer: Self-pay | Admitting: Family Medicine

## 2016-09-05 NOTE — Telephone Encounter (Signed)
Pt need new Rx for metoprolol succinate #90 and simvastatin #90  Pharm:  [facility] [place]

## 2016-09-06 MED ORDER — SIMVASTATIN 40 MG PO TABS
40.0000 mg | ORAL_TABLET | Freq: Every day | ORAL | 3 refills | Status: DC
Start: 2016-09-06 — End: 2017-10-03

## 2016-09-06 MED ORDER — METOPROLOL SUCCINATE ER 100 MG PO TB24: 100.0000 mg | ORAL_TABLET | Freq: Every day | ORAL | 3 refills | Status: DC

## 2016-09-06 NOTE — Telephone Encounter (Signed)
I sent both scripts e-scribe to [facility] for a 90 day supply with refills.

## 2017-05-26 ENCOUNTER — Other Ambulatory Visit: Payer: Self-pay | Admitting: Family Medicine

## 2017-06-04 DIAGNOSIS — Z01419 Encounter for gynecological examination (general) (routine) without abnormal findings: Secondary | ICD-10-CM | POA: Diagnosis not present

## 2017-07-31 ENCOUNTER — Telehealth: Payer: Self-pay | Admitting: Family Medicine

## 2017-07-31 NOTE — Telephone Encounter (Signed)
That’s fine with me

## 2017-07-31 NOTE — Telephone Encounter (Signed)
Pt is asking to transfer care from Dr. Clent RidgesFry to Dr. Mardelle MatteAndy. Please advise.

## 2017-08-01 NOTE — Telephone Encounter (Signed)
Please call to schedule. thanks

## 2017-08-02 NOTE — Telephone Encounter (Signed)
PT scheduled 3/19 w/ Dr. Mardelle MatteAndy

## 2017-08-05 ENCOUNTER — Encounter: Payer: BLUE CROSS/BLUE SHIELD | Admitting: Family Medicine

## 2017-08-13 ENCOUNTER — Other Ambulatory Visit: Payer: Self-pay

## 2017-08-13 ENCOUNTER — Encounter: Payer: Self-pay | Admitting: Family Medicine

## 2017-08-13 ENCOUNTER — Ambulatory Visit: Payer: BLUE CROSS/BLUE SHIELD | Admitting: Family Medicine

## 2017-08-13 VITALS — BP 138/82 | HR 78 | Temp 98.4°F | Resp 17 | Ht 66.25 in | Wt 188.8 lb

## 2017-08-13 DIAGNOSIS — Z1239 Encounter for other screening for malignant neoplasm of breast: Secondary | ICD-10-CM

## 2017-08-13 DIAGNOSIS — M159 Polyosteoarthritis, unspecified: Secondary | ICD-10-CM

## 2017-08-13 DIAGNOSIS — E782 Mixed hyperlipidemia: Secondary | ICD-10-CM

## 2017-08-13 DIAGNOSIS — Z1231 Encounter for screening mammogram for malignant neoplasm of breast: Secondary | ICD-10-CM | POA: Diagnosis not present

## 2017-08-13 DIAGNOSIS — Z0000 Encounter for general adult medical examination without abnormal findings: Secondary | ICD-10-CM

## 2017-08-13 DIAGNOSIS — I10 Essential (primary) hypertension: Secondary | ICD-10-CM | POA: Diagnosis not present

## 2017-08-13 DIAGNOSIS — Z860100 Personal history of colon polyps, unspecified: Secondary | ICD-10-CM

## 2017-08-13 DIAGNOSIS — M8949 Other hypertrophic osteoarthropathy, multiple sites: Secondary | ICD-10-CM

## 2017-08-13 DIAGNOSIS — R7301 Impaired fasting glucose: Secondary | ICD-10-CM

## 2017-08-13 DIAGNOSIS — M150 Primary generalized (osteo)arthritis: Secondary | ICD-10-CM

## 2017-08-13 LAB — CBC WITH DIFFERENTIAL/PLATELET
BASOS ABS: 0 10*3/uL (ref 0.0–0.1)
BASOS PCT: 0.6 % (ref 0.0–3.0)
EOS ABS: 0.1 10*3/uL (ref 0.0–0.7)
Eosinophils Relative: 1.5 % (ref 0.0–5.0)
HCT: 41.1 % (ref 36.0–46.0)
Hemoglobin: 13.7 g/dL (ref 12.0–15.0)
Lymphocytes Relative: 24.4 % (ref 12.0–46.0)
Lymphs Abs: 1.5 10*3/uL (ref 0.7–4.0)
MCHC: 33.4 g/dL (ref 30.0–36.0)
MCV: 90.1 fl (ref 78.0–100.0)
MONO ABS: 0.4 10*3/uL (ref 0.1–1.0)
Monocytes Relative: 7.5 % (ref 3.0–12.0)
NEUTROS ABS: 4 10*3/uL (ref 1.4–7.7)
Neutrophils Relative %: 66 % (ref 43.0–77.0)
PLATELETS: 245 10*3/uL (ref 150.0–400.0)
RBC: 4.56 Mil/uL (ref 3.87–5.11)
RDW: 13.9 % (ref 11.5–15.5)
WBC: 6 10*3/uL (ref 4.0–10.5)

## 2017-08-13 LAB — COMPREHENSIVE METABOLIC PANEL
ALT: 20 U/L (ref 0–35)
AST: 21 U/L (ref 0–37)
Albumin: 4.6 g/dL (ref 3.5–5.2)
Alkaline Phosphatase: 96 U/L (ref 39–117)
BILIRUBIN TOTAL: 0.4 mg/dL (ref 0.2–1.2)
BUN: 15 mg/dL (ref 6–23)
CHLORIDE: 105 meq/L (ref 96–112)
CO2: 26 mEq/L (ref 19–32)
CREATININE: 0.77 mg/dL (ref 0.40–1.20)
Calcium: 9.9 mg/dL (ref 8.4–10.5)
GFR: 80.74 mL/min (ref >60.00)
Glucose, Bld: 101 mg/dL — ABNORMAL HIGH (ref 70–99)
Potassium: 3.5 mEq/L (ref 3.5–5.1)
SODIUM: 139 meq/L (ref 135–145)
Total Protein: 7 g/dL (ref 6.0–8.3)

## 2017-08-13 LAB — LDL CHOLESTEROL, DIRECT: LDL DIRECT: 104 mg/dL

## 2017-08-13 LAB — HEMOGLOBIN A1C: Hgb A1c MFr Bld: 5.8 % (ref 4.6–6.5)

## 2017-08-13 LAB — LIPID PANEL
CHOLESTEROL: 172 mg/dL (ref 0–200)
HDL: 50.2 mg/dL (ref >39.00)
NonHDL: 122.09
TRIGLYCERIDES: 205 mg/dL — AB (ref 0.0–149.0)
Total CHOL/HDL Ratio: 3
VLDL: 41 mg/dL — ABNORMAL HIGH (ref 0.0–40.0)

## 2017-08-13 LAB — TSH: TSH: 1.23 u[IU]/mL (ref 0.35–4.50)

## 2017-08-13 MED ORDER — LOSARTAN POTASSIUM-HCTZ 50-12.5 MG PO TABS: 1.0000 | ORAL_TABLET | Freq: Every day | ORAL | 3 refills | Status: DC

## 2017-08-13 NOTE — Progress Notes (Signed)
Subjective  Chief Complaint  Patient presents with  . Establish Care    Needs refill on meds, shingles vaccine??    HPI: Melissa Bryant is a 62 y.o. female who presents to [facility] today for a Female Wellness Visit and to establish care with me. Former patient of Dr. Abran Cantor; our practice happens to be closer to her home.  She also needs f/u on her chronic medical problems of HTN and hyperlipidemia. These will be addressed in addition to the Health Maintenance Visit.   Wellness Visit: annual visit with health maintenance review and exam without Pap   Last GYN exam 03/2017 and pap is up to date. Needs mammogram. Gets at breast center.   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 Husband works for [employer]. Likes to travel. Exercises regularly with "females in action" group. Eats a healthy balanced diet.   CRC screen is up to date. Most recent colonoscopy was normal. Dr. Kinnie Scales  imms - due shingrix.   Eye is up to date; wears contacts  Lifestyle: Body mass index is 30.24 kg/m. Wt Readings from Last 3 Encounters:  08/13/17 188 lb 12.8 oz (85.6 kg)  07/03/16 187 lb (84.8 kg)  06/08/15 186 lb (84.4 kg)   Diet: low fat Exercise: frequently, running/ jogging, walking and weightlifting  Chronic disease management visit and/or acute problem visit:  Hypertension f/u: Control is fair . Pt reports she is doing well. taking medications as instructed, no medication side effects noted, no TIAs, no chest pain on exertion, no dyspnea on exertion, no swelling of ankles. Over last several years, control has been marginal. At one time, was on micardis - this was stopped after a single episode of lightheadedness occurred.  She denies adverse effects from his BP medications. Compliance with medication is good.   Hyperlipidemia f/u: Patient presents for follow up  of lipids. Lipids have been well controlled on meds w/o myalgias or side effects. Compliance with treatment thus far has been excellent. The patient does not use medications that may worsen dyslipidemias (corticosteroids, progestins, anabolic steroids, diuretics, beta-blockers, amiodarone, cyclosporine, olanzapine). The patient exercises frequently. The patient is not known to have coexisting coronary artery disease.   OA - knees. Not too bothersome.   IFG: last year with fasting glucose 110. Denies sxs of hyperglycemia. Hasn't been rechecked.   BP Readings from Last 3 Encounters:  08/13/17 138/82  07/03/16 (!) 164/79  09/05/15 (!) 148/77   Wt Readings from Last 3 Encounters:  08/13/17 188 lb 12.8 oz (85.6 kg)  07/03/16 187 lb (84.8 kg)  06/08/15 186 lb (84.4 kg)    Lab Results  Component Value Date   CHOL 164 06/26/2016   CHOL 155 05/10/2015   CHOL 144 05/13/2014   Lab Results  Component Value Date   HDL 50.30 06/26/2016   HDL 57.80 05/10/2015   HDL 46.20 05/13/2014   Lab Results  Component Value Date   LDLCALC 81 06/26/2016   LDLCALC 79 05/10/2015   LDLCALC 80 05/13/2014   Lab Results  Component Value Date   TRIG 164.0 (H) 06/26/2016   TRIG 94.0 05/10/2015   TRIG 87.0 05/13/2014   Lab Results  Component Value Date   CHOLHDL 3 06/26/2016   CHOLHDL 3 05/10/2015   CHOLHDL 3 05/13/2014   No results found for: LDLDIRECT Lab Results  Component Value Date   CREATININE 0.81 06/26/2016  BUN 12 06/26/2016   NA 142 06/26/2016   K 4.4 06/26/2016   CL 107 06/26/2016   CO2 29 06/26/2016    The 10-year ASCVD risk score Denman George DC Jr., et al., 2013) is: 5.2%   Values used to calculate the score:     Age: 58 years     Sex: Female     Is Non-Hispanic African American: No     Diabetic: No     Tobacco smoker: No     Systolic Blood Pressure: 138 mmHg     Is BP treated: Yes     HDL Cholesterol: 50.3 mg/dL     Total Cholesterol: 164 mg/dL   Patient Active Problem List    Diagnosis Date Noted  . Mixed hyperlipidemia 02/11/2009    Priority: High  . Essential hypertension 02/11/2009    Priority: High  . Osteoarthritis, multiple sites 02/11/2009    Priority: Medium  . History of colon polyps - benign, q 76yr 02/11/2009    Priority: Low   Health Maintenance  Topic Date Due  . Hepatitis C Screening  1955-11-07  . HIV Screening  08/20/1970  . MAMMOGRAM  05/04/2016  . PAP SMEAR  04/11/2021  . TETANUS/TDAP  03/30/2024  . COLONOSCOPY  08/07/2026  . INFLUENZA VACCINE  Completed   Immunization History  Administered Date(s) Administered  . Influenza Split 02/26/2012  . Influenza Whole 02/01/2010  . Influenza,inj,Quad PF,6+ Mos 03/31/2013, 06/08/2015  . Influenza-Unspecified 02/26/2016  . Td 05/29/2003, 03/30/2014   We updated and reviewed the patient's past history in detail and it is documented below. Allergies: Patient is allergic to sulfonamide derivatives. Past Medical History Patient  has a past medical history of Heart murmur, colonic polyps, Hyperlipidemia, Hypertension, and Osteoarthritis. Past Surgical History Patient  has a past surgical history that includes Knee arthroscopy and arthrotomy (2007); Colonoscopy (2009 in [place], [place].); and Tubal ligation. Family History: Patient family history includes Alcohol abuse in her father; Arthritis in her brother and brother; Diabetes in her brother and brother; Healthy in her son, son, and son; Hyperlipidemia in her brother and brother; Lung cancer in her mother; Stroke in her maternal grandfather. Social History:  Patient  reports that  has never smoked. she has never used smokeless tobacco. She reports that she drinks alcohol. She reports that she does not use drugs.  Review of Systems: Constitutional: negative for fever or malaise Ophthalmic: negative for photophobia, double vision or loss of vision Cardiovascular: negative for chest pain, dyspnea on exertion, or new LE swelling Respiratory:  negative for SOB or persistent cough Gastrointestinal: negative for abdominal pain, change in bowel habits or melena Genitourinary: negative for dysuria or gross hematuria, no abnormal uterine bleeding or disharge Musculoskeletal: negative for new gait disturbance or muscular weakness Integumentary: negative for new or persistent rashes, no breast lumps Neurological: negative for TIA or stroke symptoms Psychiatric: negative for SI or delusions Allergic/Immunologic: negative for hives  Patient Care Team    Relationship Specialty Notifications Start End  Willow Ora, MD PCP - General Family Medicine  08/13/17   Lenn Sink, DPM Consulting Physician Podiatry  08/13/17   Sharrell Ku, MD Consulting Physician Gastroenterology  08/13/17   Gerald Leitz, MD Consulting Physician Obstetrics and Gynecology  08/13/17   Sheppard Evens  Ophthalmology  08/13/17     Objective  Vitals: BP 138/82   Pulse 78   Temp 98.4 F (36.9 C) (Oral)   Resp 17   Ht 5' 6.25" (1.683 m)  Wt 188 lb 12.8 oz (85.6 kg)   SpO2 98%   BMI 30.24 kg/m  General:  Well developed, well nourished, no acute distress  Psych:  Alert and orientedx3,normal mood and affect HEENT:  Normocephalic, atraumatic, non-icteric sclera, PERRL, oropharynx is clear without mass or exudate, supple neck without adenopathy, mass or thyromegaly Cardiovascular:  Normal S1, S2, RRR without gallop, rub or murmur, nondisplaced PMI Respiratory:  Good breath sounds bilaterally, CTAB with normal respiratory effort Gastrointestinal: normal bowel sounds, soft, non-tender, no noted masses. No HSM MSK: no deformities, contusions. Joints are without erythema or swelling. Spine and CVA region are nontender. Bilateral knees with crepitus, nontender, from no effusion or warmth Skin:  Warm, no rashes or suspicious lesions noted. Varicose veins present bilateral lower extremity - no tenderness Neurologic:    Mental status is normal. CN 2-11 are normal. Gross  motor and sensory exams are normal. Normal gait. No tremor Breast Exam: No mass, skin retraction or nipple discharge is appreciated in either breast. No axillary adenopathy. Fibrocystic changes are not noted   Assessment  1. Annual physical exam   2. Essential hypertension   3. Mixed hyperlipidemia   4. Impaired fasting glucose   5. Primary osteoarthritis involving multiple joints   6. Breast cancer screening   7. History of colon polyps - benign, q 6571yr      Plan  Female Wellness Visit:  Age appropriate Health Maintenance and Prevention measures were discussed with patient. Included topics are cancer screening recommendations, ways to keep healthy (see AVS) including dietary and exercise recommendations, regular eye and dental care, use of seat belts, and avoidance of moderate alcohol use and tobacco use. Mammogram ordered.   BMI: discussed patient's BMI and encouraged positive lifestyle modifications to help get to or maintain a target BMI.  HM needs and immunizations were addressed and ordered. See below for orders. See HM and immunization section for updates. shingrix is on backorder; will immunize at next visit.   Routine labs and screening tests ordered including cmp, cbc and lipids where appropriate.  Discussed recommendations regarding Vit D and calcium supplementation (see AVS)  Chronic disease f/u and/or acute problem visit: (deemed necessary to be done in addition to the wellness visit):  Hypertension follow up: bp control is fair. Will change to losartan hctz to get control to good. Discussed goals. Continue low sodium diet and regular exercise. Recheck 6 months.  Hyperlipidemia f/u: nonfasting labs today. Adjust statin dose as needed. Check lfts  IFG: nonfasting labs today. Check a1c.   OA - stable.   Follow up: Return in about 6 months (around 02/13/2018) for follow up Hypertension.   Commons side effects, risks, benefits, and alternatives for medications and  treatment plan prescribed today were discussed, and the patient expressed understanding of the given instructions. Patient is instructed to call or message via MyChart if he/she has any questions or concerns regarding our treatment plan. No barriers to understanding were identified. We discussed Red Flag symptoms and signs in detail. Patient expressed understanding regarding what to do in case of urgent or emergency type symptoms.   Medication list was reconciled, printed and provided to the patient in AVS. Patient instructions and summary information was reviewed with the patient as documented in the AVS. This note was prepared with assistance of Dragon voice recognition software. Occasional wrong-word or sound-a-like substitutions may have occurred due to the inherent limitations of voice recognition software  Orders Placed This Encounter  Procedures  . MM DIGITAL SCREENING  BILATERAL  . Comprehensive metabolic panel  . CBC with Differential/Platelet  . Lipid panel  . TSH  . Hepatitis C antibody  . HIV antibody  . Hemoglobin A1c   Meds ordered this encounter  Medications  . losartan-hydrochlorothiazide (HYZAAR) 50-12.5 MG tablet    Sig: Take 1 tablet by mouth daily.    Dispense:  90 tablet    Refill:  3

## 2017-08-13 NOTE — Patient Instructions (Addendum)
Please return in 6 months for hypertension follow up. I've changed your losartan to losartan - hctz today.   Please sign up for mychart.  I will release your lab results to you on your MyChart account with further instructions. Please reply with any questions.   We will call you with information regarding your referral appointment. Mammogram.  It was a pleasure meeting you today! Thank you for choosing [place] to meet your healthcare needs! I truly look forward to working with you. If you have any questions or concerns, please send me a message via Mychart or call the office at [phone].  Please do these things to maintain good health!   Exercise at least 30-45 minutes a day,  4-5 days a week.   Eat a low-fat diet with lots of fruits and vegetables, up to 7-9 servings per day.  Drink plenty of water daily. Try to drink 8 8oz glasses per day.  Seatbelts can save your life. Always wear your seatbelt.  Place Smoke Detectors on every level of your home and check batteries every year.  Schedule an appointment with an eye doctor for an eye exam every 1-2 years  Safe sex - use condoms to protect yourself from STDs if you could be exposed to these types of infections. Use birth control if you do not want to become pregnant and are sexually active.  Avoid heavy alcohol use. If you drink, keep it to less than 2 drinks/day and not every day.  Health Care Power of Attorney.  Choose someone you trust that could speak for you if you became unable to speak for yourself.  Depression is common in our stressful world.If you're feeling down or losing interest in things you normally enjoy, please come in for a visit.  If anyone is threatening or hurting you, please get help. Physical or Emotional Violence is never OK.

## 2017-08-14 LAB — HEPATITIS C ANTIBODY
HEP C AB: NONREACTIVE
SIGNAL TO CUT-OFF: 0.08 (ref <1.00)

## 2017-08-14 LAB — HIV ANTIBODY (ROUTINE TESTING W REFLEX): HIV: NONREACTIVE

## 2017-09-03 ENCOUNTER — Ambulatory Visit
Admission: RE | Admit: 2017-09-03 | Discharge: 2017-09-03 | Disposition: A | Discharge status: Home / Self Care | Payer: BLUE CROSS/BLUE SHIELD | Source: Ambulatory Visit | Attending: Family Medicine | Admitting: Family Medicine

## 2017-09-03 DIAGNOSIS — Z1239 Encounter for other screening for malignant neoplasm of breast: Secondary | ICD-10-CM

## 2017-09-03 DIAGNOSIS — Z1231 Encounter for screening mammogram for malignant neoplasm of breast: Secondary | ICD-10-CM | POA: Diagnosis not present

## 2017-09-08 ENCOUNTER — Encounter: Payer: Self-pay | Admitting: Family Medicine

## 2017-09-09 MED ORDER — METOPROLOL SUCCINATE ER 100 MG PO TB24
100.0000 mg | ORAL_TABLET | Freq: Every day | ORAL | 3 refills | Status: DC
Start: 2017-09-09 — End: 2018-08-19

## 2017-09-18 NOTE — Progress Notes (Signed)
Called breast center and left message to return call regarding results. Kathi SimpersAmy Winola Drum,  LPN

## 2017-10-03 ENCOUNTER — Other Ambulatory Visit: Payer: Self-pay | Admitting: Family Medicine

## 2017-10-03 NOTE — Telephone Encounter (Signed)
See Telephone encounter.   Please advise.    OK for Simvastain Refill. ?

## 2017-10-03 NOTE — Telephone Encounter (Signed)
Pt states that she has received a letter from [facility] stating a recall on losartan and states that telmisartan would be the next Rx to try, pt is aslo asking for a refill on simvastatin, please advise.

## 2017-10-04 MED ORDER — TELMISARTAN-HCTZ 40-12.5 MG PO TABS: 1.0000 | ORAL_TABLET | Freq: Every day | ORAL | 3 refills | Status: DC

## 2017-10-04 MED ORDER — SIMVASTATIN 40 MG PO TABS: 40.0000 mg | ORAL_TABLET | Freq: Every day | ORAL | 3 refills | Status: DC

## 2017-10-04 NOTE — Telephone Encounter (Signed)
Changed to micardis hctz; had taken once in past with lightheadedness so will follow. Doubt related to ARB.  Refilled statin. ldl 104.

## 2018-01-09 ENCOUNTER — Other Ambulatory Visit: Payer: Self-pay | Admitting: Family Medicine

## 2018-02-11 ENCOUNTER — Ambulatory Visit: Payer: BLUE CROSS/BLUE SHIELD | Admitting: Family Medicine

## 2018-02-17 ENCOUNTER — Other Ambulatory Visit: Payer: Self-pay

## 2018-02-17 ENCOUNTER — Ambulatory Visit: Payer: BLUE CROSS/BLUE SHIELD | Admitting: Family Medicine

## 2018-02-17 ENCOUNTER — Encounter: Payer: Self-pay | Admitting: Family Medicine

## 2018-02-17 VITALS — BP 130/70 | HR 88 | Temp 98.0°F | Ht 66.5 in | Wt 194.2 lb

## 2018-02-17 DIAGNOSIS — J302 Other seasonal allergic rhinitis: Secondary | ICD-10-CM

## 2018-02-17 DIAGNOSIS — Z23 Encounter for immunization: Secondary | ICD-10-CM | POA: Diagnosis not present

## 2018-02-17 DIAGNOSIS — I10 Essential (primary) hypertension: Secondary | ICD-10-CM | POA: Diagnosis not present

## 2018-02-17 MED ORDER — FLUTICASONE PROPIONATE 50 MCG/ACT NA SUSP: 2.0000 | Freq: Every day | NASAL | 6 refills | Status: DC

## 2018-02-17 NOTE — Patient Instructions (Signed)
Please return in 6 months for your annual complete physical; please come fasting.   If you have any questions or concerns, please don't hesitate to send me a message via MyChart or call the office at [phone]. Thank you for visiting with us today! It's our pleasure caring for you.  Start claritin, Careers adviserallegra or Zyrtec daily in addition to the flonase nasal spray.   Allergic Rhinitis, Adult Allergic rhinitis is an allergic reaction that affects the mucous membrane inside the nose. It causes sneezing, a runny or stuffy nose, and the feeling of mucus going down the back of the throat (postnasal drip). Allergic rhinitis can be mild to severe. There are two types of allergic rhinitis:  Seasonal. This type is also called hay fever. It happens only during certain seasons.  Perennial. This type can happen at any time of the year.  What are the causes? This condition happens when the body's defense system (immune system) responds to certain harmless substances called allergens as though they were germs.  Seasonal allergic rhinitis is triggered by pollen, which can come from grasses, trees, and weeds. Perennial allergic rhinitis may be caused by:  House dust mites.  Pet dander.  Mold spores.  What are the signs or symptoms? Symptoms of this condition include:  Sneezing.  Runny or stuffy nose (nasal congestion).  Postnasal drip.  Itchy nose.  Tearing of the eyes.  Trouble sleeping.  Daytime sleepiness.  How is this diagnosed? This condition may be diagnosed based on:  Your medical history.  A physical exam.  Tests to check for related conditions, such as: ? Asthma. ? Pink eye. ? Ear infection. ? Upper respiratory infection.  Tests to find out which allergens trigger your symptoms. These may include skin or blood tests.  How is this treated? There is no cure for this condition, but treatment can help control symptoms. Treatment may include:  Taking medicines that  block allergy symptoms, such as antihistamines. Medicine may be given as a shot, nasal spray, or pill.  Avoiding the allergen.  Desensitization. This treatment involves getting ongoing shots until your body becomes less sensitive to the allergen. This treatment may be done if other treatments do not help.  If taking medicine and avoiding the allergen does not work, new, stronger medicines may be prescribed.  Follow these instructions at home:  Find out what you are allergic to. Common allergens include smoke, dust, and pollen.  Avoid the things you are allergic to. These are some things you can do to help avoid allergens: ? Replace carpet with wood, tile, or vinyl flooring. Carpet can trap dander and dust. ? Do not smoke. Do not allow smoking in your home. ? Change your heating and air conditioning filter at least once a month. ? During allergy season:  Keep windows closed as much as possible.  Plan outdoor activities when pollen counts are lowest. This is usually during the evening hours.  When coming indoors, change clothing and shower before sitting on furniture or bedding.  Take over-the-counter and prescription medicines only as told by your health care provider.  Keep all follow-up visits as told by your health care provider. This is important. Contact a health care provider if:  You have a fever.  You develop a persistent cough.  You make whistling sounds when you breathe (you wheeze).  Your symptoms interfere with your normal daily activities. Get help right away if:  You have shortness of breath. Summary  This condition can be managed by  taking medicines as directed and avoiding allergens.  Contact your health care provider if you develop a persistent cough or fever.  During allergy season, keep windows closed as much as possible. This information is not intended to replace advice given to you by your health care provider. Make sure you discuss any questions you  have with your health care provider. Document Released: 02/06/2001 Document Revised: 06/21/2016 Document Reviewed: 06/21/2016 Elsevier Interactive Patient Education  [employer].

## 2018-02-17 NOTE — Progress Notes (Signed)
Subjective  CC:  Chief Complaint  Patient presents with  . Hypertension    doing well, requests flu shot today, wants to discuss shingles vaccine  . Sinus Problem    post nasal drip     HPI: Melissa Bryant is a 62 y.o. female who presents to the office today to address the problems listed above in the chief complaint.  Hypertension f/u: Control is good . Pt reports she is doing well. taking medications as instructed, no medication side effects noted, no TIAs, no chest pain on exertion, no dyspnea on exertion, no swelling of ankles. Tolerating micardis hct w/o problems. Home readings are excellent: 120-130/70s.  She denies adverse effects from his BP medications. Compliance with medication is good.   Notes voice is cracking at church; associated with PND, nasal congestion, ear pressure, watering eyes, and itching eyes. Tried an occ claritin w/o relief. No sinus pain, persistent hoarseness, st.  Assessment  1. Essential hypertension   2. Need for influenza vaccination   3. Seasonal allergic rhinitis, unspecified trigger      Plan    Hypertension f/u: BP control is well controlled. This medical condition is well controlled. There are no signs of complications, medication side effects, or red flags. Patient is instructed to continue the current treatment plan without change in therapies or medications.   AR: educated. Start meds. F/u if persists.   Flu shot today. Education regarding management of these chronic disease states was given. Management strategies discussed on successive visits include dietary and exercise recommendations, goals of achieving and maintaining IBW, and lifestyle modifications aiming for adequate sleep and minimizing stressors.   Follow up: Return in about 6 months (around 08/18/2018) for complete physical.  Orders Placed This Encounter  Procedures  . Flu Vaccine QUAD 36+ mos IM   Meds ordered this encounter  Medications  . fluticasone (FLONASE) 50 MCG/ACT  nasal spray    Sig: Place 2 sprays into both nostrils daily.    Dispense:  16 g    Refill:  6      BP Readings from Last 3 Encounters:  02/17/18 130/70  08/13/17 138/82  07/03/16 (!) 164/79   Wt Readings from Last 3 Encounters:  02/17/18 194 lb 3.2 oz (88.1 kg)  08/13/17 188 lb 12.8 oz (85.6 kg)  07/03/16 187 lb (84.8 kg)    Lab Results  Component Value Date   CHOL 172 08/13/2017   CHOL 164 06/26/2016   CHOL 155 05/10/2015   Lab Results  Component Value Date   HDL 50.20 08/13/2017   HDL 50.30 06/26/2016   HDL 57.80 05/10/2015   Lab Results  Component Value Date   LDLCALC 81 06/26/2016   LDLCALC 79 05/10/2015   LDLCALC 80 05/13/2014   Lab Results  Component Value Date   TRIG 205.0 (H) 08/13/2017   TRIG 164.0 (H) 06/26/2016   TRIG 94.0 05/10/2015   Lab Results  Component Value Date   CHOLHDL 3 08/13/2017   CHOLHDL 3 06/26/2016   CHOLHDL 3 05/10/2015   Lab Results  Component Value Date   LDLDIRECT 104.0 08/13/2017   Lab Results  Component Value Date   CREATININE 0.77 08/13/2017   BUN 15 08/13/2017   NA 139 08/13/2017   K 3.5 08/13/2017   CL 105 08/13/2017   CO2 26 08/13/2017    The 10-year ASCVD risk score (Goff DC Jr., et al., 2013) is: 5.3%   Values used to calculate the score:     Age: [age]  years     Sex: Female     Is Non-Hispanic African American: No     Diabetic: No     Tobacco smoker: No     Systolic Blood Pressure: 130 mmHg     Is BP treated: Yes     HDL Cholesterol: 50.2 mg/dL     Total Cholesterol: 172 mg/dL  I reviewed the patients updated PMH, FH, and SocHx.    Patient Active Problem List   Diagnosis Date Noted  . Mixed hyperlipidemia 02/11/2009    Priority: High  . Essential hypertension 02/11/2009    Priority: High  . Osteoarthritis, multiple sites 02/11/2009    Priority: Medium  . History of colon polyps - benign, q 38yr 02/11/2009    Priority: Low    Allergies: Sulfonamide derivatives  Social History: Patient   reports that she has never smoked. She has never used smokeless tobacco. She reports that she drinks alcohol. She reports that she does not use drugs.  Current Meds  Medication Sig  . metoprolol succinate (TOPROL-XL) 100 MG 24 hr tablet Take 1 tablet (100 mg total) by mouth daily. Take with or immediately following a meal.  . simvastatin (ZOCOR) 40 MG tablet Take 1 tablet (40 mg total) by mouth at bedtime.  [name] telmisartan-hydrochlorothiazide (MICARDIS HCT) 40-12.5 MG tablet Take 1 tablet by mouth daily.    Review of Systems: Cardiovascular: negative for chest pain, palpitations, leg swelling, orthopnea Respiratory: negative for SOB, wheezing or persistent cough Gastrointestinal: negative for abdominal pain Genitourinary: negative for dysuria or gross hematuria  Objective  Vitals: BP 130/70 Comment: by consistent home readings  Pulse 88   Temp 98 F (36.7 C)   Ht 5' 6.5" (1.689 m)   Wt 194 lb 3.2 oz (88.1 kg)   SpO2 98%   BMI 30.88 kg/m  General: no acute distress  Psych:  Alert and oriented, normal mood and affect HEENT:  Normocephalic, atraumatic, supple neck, boggy nasal mucosa, TMs clear B, clear OP, no LAD Cardiovascular:  RRR without murmur. no edema Respiratory:  Good breath sounds bilaterally, CTAB with normal respiratory effort Skin:  Warm, no rashes Neurologic:   Mental status is normal  Commons side effects, risks, benefits, and alternatives for medications and treatment plan prescribed today were discussed, and the patient expressed understanding of the given instructions. Patient is instructed to call or message via MyChart if he/she has any questions or concerns regarding our treatment plan. No barriers to understanding were identified. We discussed Red Flag symptoms and signs in detail. Patient expressed understanding regarding what to do in case of urgent or emergency type symptoms.   Medication list was reconciled, printed and provided to the patient in AVS. Patient  instructions and summary information was reviewed with the patient as documented in the AVS. This note was prepared with assistance of Dragon voice recognition software. Occasional wrong-word or sound-a-like substitutions may have occurred due to the inherent limitations of voice recognition software

## 2018-08-18 ENCOUNTER — Encounter: Payer: Self-pay | Admitting: Family Medicine

## 2018-08-19 ENCOUNTER — Other Ambulatory Visit: Payer: Self-pay | Admitting: *Deleted

## 2018-08-19 MED ORDER — SIMVASTATIN 40 MG PO TABS: 40.0000 mg | ORAL_TABLET | Freq: Every day | ORAL | 0 refills | Status: DC

## 2018-08-19 MED ORDER — TELMISARTAN-HCTZ 40-12.5 MG PO TABS: 1.0000 | ORAL_TABLET | Freq: Every day | ORAL | 0 refills | Status: DC

## 2018-08-19 MED ORDER — METOPROLOL SUCCINATE ER 100 MG PO TB24: 100.0000 mg | ORAL_TABLET | Freq: Every day | ORAL | 0 refills | Status: DC

## 2018-09-16 ENCOUNTER — Other Ambulatory Visit: Payer: Self-pay | Admitting: Family Medicine

## 2018-11-18 ENCOUNTER — Other Ambulatory Visit: Payer: Self-pay | Admitting: Family Medicine

## 2018-11-19 ENCOUNTER — Ambulatory Visit (INDEPENDENT_AMBULATORY_CARE_PROVIDER_SITE_OTHER): Payer: BC Managed Care – PPO | Admitting: Family Medicine

## 2018-11-19 ENCOUNTER — Encounter: Payer: Self-pay | Admitting: Family Medicine

## 2018-11-19 ENCOUNTER — Other Ambulatory Visit: Payer: Self-pay

## 2018-11-19 VITALS — BP 128/78 | Wt 190.0 lb

## 2018-11-19 DIAGNOSIS — L709 Acne, unspecified: Secondary | ICD-10-CM | POA: Insufficient documentation

## 2018-11-19 DIAGNOSIS — E782 Mixed hyperlipidemia: Secondary | ICD-10-CM | POA: Diagnosis not present

## 2018-11-19 DIAGNOSIS — R7301 Impaired fasting glucose: Secondary | ICD-10-CM | POA: Diagnosis not present

## 2018-11-19 DIAGNOSIS — I10 Essential (primary) hypertension: Secondary | ICD-10-CM

## 2018-11-19 MED ORDER — SIMVASTATIN 40 MG PO TABS: 40.0000 mg | ORAL_TABLET | Freq: Every day | ORAL | 3 refills | Status: DC

## 2018-11-19 MED ORDER — CLINDAMYCIN PHOSPHATE 1 % EX SOLN: Freq: Two times a day (BID) | CUTANEOUS | 2 refills | Status: DC

## 2018-11-19 MED ORDER — TELMISARTAN-HCTZ 40-12.5 MG PO TABS: 1.0000 | ORAL_TABLET | Freq: Every day | ORAL | 3 refills | Status: DC

## 2018-11-19 MED ORDER — METOPROLOL SUCCINATE ER 100 MG PO TB24: 100.0000 mg | ORAL_TABLET | Freq: Every day | ORAL | 3 refills | Status: DC

## 2018-11-19 NOTE — Progress Notes (Signed)
Virtual Visit via Video Note  Subjective  CC:  Chief Complaint  Patient presents with  . Hypertension  . Hyperlipidemia  . Acne    Clindamycin 1% topical was given by Dr. Clent RidgesFry. for outbreaks, wanting to get a refill     I connected with Melissa Bryant on 11/19/18 at  9:40 AM EDT by a video enabled telemedicine application and verified that I am speaking with the correct person using two identifiers. Location patient: Home Location provider: [facility] Persons participating in the virtual visit: Melissa Bryant, Willow Oraamille L , MD Rita Oharaiara Simmons, CMA  I discussed the limitations of evaluation and management by telemedicine and the availability of in person appointments. The patient expressed understanding and agreed to proceed. HPI: Melissa Bryant is a 63 y.o. female who was contacted today to address the problems listed above in the chief complaint/HTN f/u:  . Hypertension f/u: last cpe with labs 07/2017; last visit 01/2018. Needs refills. Control is good . Pt reports she is doing well. taking medications as instructed, no medication side effects noted, no TIAs, no chest pain on exertion, no dyspnea on exertion, no swelling of ankles. No concerns.  She denies adverse effects from his BP medications. Compliance with medication is good.  [name]. HLD: stable on statin w/o AEs. Due blood work . IFG: denies sxs of hyperglycemia.  . Adult acne: has been treated by derm. Uses cleocin soln prn for break outs. Typically related to foods. No rosacea.   BP Readings from Last 3 Encounters:  11/19/18 128/78  02/17/18 130/70  08/13/17 138/82   Wt Readings from Last 3 Encounters:  11/19/18 190 lb (86.2 kg)  02/17/18 194 lb 3.2 oz (88.1 kg)  08/13/17 188 lb 12.8 oz (85.6 kg)    Lab Results  Component Value Date   CHOL 172 08/13/2017   CHOL 164 06/26/2016   CHOL 155 05/10/2015   Lab Results  Component Value Date   HDL 50.20 08/13/2017   HDL 50.30 06/26/2016   HDL  57.80 05/10/2015   Lab Results  Component Value Date   LDLCALC 81 06/26/2016   LDLCALC 79 05/10/2015   LDLCALC 80 05/13/2014   Lab Results  Component Value Date   TRIG 205.0 (H) 08/13/2017   TRIG 164.0 (H) 06/26/2016   TRIG 94.0 05/10/2015   Lab Results  Component Value Date   CHOLHDL 3 08/13/2017   CHOLHDL 3 06/26/2016   CHOLHDL 3 05/10/2015   Lab Results  Component Value Date   LDLDIRECT 104.0 08/13/2017   Lab Results  Component Value Date   CREATININE 0.77 08/13/2017   BUN 15 08/13/2017   NA 139 08/13/2017   K 3.5 08/13/2017   CL 105 08/13/2017   CO2 26 08/13/2017    The 10-year ASCVD risk score Denman George(Goff DC Jr., et al., 2013) is: 5.8%   Values used to calculate the score:     Age: [age] years     Sex: Female     Is Non-Hispanic African American: No     Diabetic: No     Tobacco smoker: No     Systolic Blood Pressure: 128 mmHg     Is BP treated: Yes     HDL Cholesterol: 50.2 mg/dL     Total Cholesterol: 172 mg/dL  Assessment  1. Essential hypertension   2. Mixed hyperlipidemia   3. Impaired fasting glucose   4. Adult acne      Plan   Hypertension /  HLD/ IFG f/u:  Well controlled. Refilled meds but need OV for CPE with lab f/u. Discussed need to check renal, lytes and lipids.   Adult acne: well managed with prn topical abx soln. Refilled.   HM: needs cpe and mammo.  I discussed the assessment and treatment plan with the patient. The patient was provided an opportunity to ask questions and all were answered. The patient agreed with the plan and demonstrated an understanding of the instructions.   The patient was advised to call back or seek an in-person evaluation if the symptoms worsen or if the condition fails to improve as anticipated. Follow up: cpe with labs. Pt to schedule.  Visit date not found  Meds ordered this encounter  Medications  . metoprolol succinate (TOPROL-XL) 100 MG 24 hr tablet    Sig: Take 1 tablet (100 mg total) by mouth daily. Take  with or immediately following a meal.    Dispense:  90 tablet    Refill:  3  . simvastatin (ZOCOR) 40 MG tablet    Sig: Take 1 tablet (40 mg total) by mouth at bedtime.    Dispense:  90 tablet    Refill:  3  . telmisartan-hydrochlorothiazide (MICARDIS HCT) 40-12.5 MG tablet    Sig: Take 1 tablet by mouth daily.    Dispense:  90 tablet    Refill:  3  . clindamycin (CLEOCIN T) 1 % external solution    Sig: Apply topically 2 (two) times daily.    Dispense:  60 mL    Refill:  2      I reviewed the patients updated PMH, FH, and SocHx.    Patient Active Problem List   Diagnosis Date Noted  . Mixed hyperlipidemia 02/11/2009    Priority: High  . Essential hypertension 02/11/2009    Priority: High  . Osteoarthritis, multiple sites 02/11/2009    Priority: Medium  . History of colon polyps - benign, q 41yr 02/11/2009    Priority: Low  . Adult acne 11/19/2018  . Impaired fasting glucose 11/19/2018   Current Meds  Medication Sig  . b complex vitamins tablet Take 1 tablet by mouth daily.    . clindamycin (CLEOCIN T) 1 % external solution Apply topically 2 (two) times daily.  . fluticasone (FLONASE) 50 MCG/ACT nasal spray Place 2 sprays into both nostrils daily.  [name] ibuprofen (ADVIL,MOTRIN) 200 MG tablet Take 200 mg by mouth as needed. Reported on 06/08/2015  . metoprolol succinate (TOPROL-XL) 100 MG 24 hr tablet Take 1 tablet (100 mg total) by mouth daily. Take with or immediately following a meal.  . simvastatin (ZOCOR) 40 MG tablet Take 1 tablet (40 mg total) by mouth at bedtime.  [name] telmisartan-hydrochlorothiazide (MICARDIS HCT) 40-12.5 MG tablet Take 1 tablet by mouth daily.  [name] triamcinolone cream (KENALOG) 0.1 % Apply topically as needed.  . [DISCONTINUED] clindamycin (CLEOCIN T) 1 % external solution Apply topically 2 (two) times daily.  . [DISCONTINUED] metoprolol succinate (TOPROL-XL) 100 MG 24 hr tablet Take 1 tablet (100 mg total) by mouth daily. Take with or immediately  following a meal.  . [DISCONTINUED] simvastatin (ZOCOR) 40 MG tablet Take 1 tablet (40 mg total) by mouth at bedtime.  . [DISCONTINUED] telmisartan-hydrochlorothiazide (MICARDIS HCT) 40-12.5 MG tablet Take 1 tablet by mouth daily.    Allergies: Patient is allergic to sulfonamide derivatives. Family History: Patient family history includes Alcohol abuse in her father; Arthritis in her brother and brother; Diabetes in her brother and brother; Healthy in  her son, son, and son; Hyperlipidemia in her brother and brother; Lung cancer in her mother; Stroke in her maternal grandfather. Social History:  Patient  reports that she has never smoked. She has never used smokeless tobacco. She reports current alcohol use. She reports that she does not use drugs.  Review of Systems: Constitutional: Negative for fever malaise or anorexia Cardiovascular: negative for chest pain Respiratory: negative for SOB or persistent cough Gastrointestinal: negative for abdominal pain  OBJECTIVE Vitals: BP 128/78 Comment: pt reported, home reading  Wt 190 lb (86.2 kg)   BMI 30.21 kg/m  General: no acute distress , A&Ox3  Willow Oraamille L , MD

## 2018-11-24 ENCOUNTER — Other Ambulatory Visit (HOSPITAL_COMMUNITY)
Admission: RE | Admit: 2018-11-24 | Discharge: 2018-11-24 | Disposition: A | Discharge status: Home / Self Care | Payer: BC Managed Care – PPO | Source: Ambulatory Visit | Attending: Obstetrics and Gynecology | Admitting: Obstetrics and Gynecology

## 2018-11-24 ENCOUNTER — Other Ambulatory Visit: Payer: Self-pay | Admitting: Obstetrics and Gynecology

## 2018-11-24 DIAGNOSIS — Z01419 Encounter for gynecological examination (general) (routine) without abnormal findings: Secondary | ICD-10-CM | POA: Diagnosis not present

## 2018-11-26 LAB — CYTOLOGY - PAP
Diagnosis: NEGATIVE
HPV: NOT DETECTED

## 2019-01-01 ENCOUNTER — Encounter: Payer: Self-pay | Admitting: Family Medicine

## 2019-01-01 ENCOUNTER — Other Ambulatory Visit: Payer: Self-pay

## 2019-01-01 ENCOUNTER — Ambulatory Visit (INDEPENDENT_AMBULATORY_CARE_PROVIDER_SITE_OTHER): Payer: BC Managed Care – PPO | Admitting: Family Medicine

## 2019-01-01 VITALS — BP 138/80 | HR 70 | Temp 98.0°F | Resp 16 | Ht 66.0 in | Wt 194.6 lb

## 2019-01-01 DIAGNOSIS — M150 Primary generalized (osteo)arthritis: Secondary | ICD-10-CM | POA: Diagnosis not present

## 2019-01-01 DIAGNOSIS — I10 Essential (primary) hypertension: Secondary | ICD-10-CM | POA: Diagnosis not present

## 2019-01-01 DIAGNOSIS — Z86010 Personal history of colonic polyps: Secondary | ICD-10-CM | POA: Diagnosis not present

## 2019-01-01 DIAGNOSIS — Z0000 Encounter for general adult medical examination without abnormal findings: Secondary | ICD-10-CM

## 2019-01-01 DIAGNOSIS — Z23 Encounter for immunization: Secondary | ICD-10-CM

## 2019-01-01 DIAGNOSIS — M8949 Other hypertrophic osteoarthropathy, multiple sites: Secondary | ICD-10-CM

## 2019-01-01 DIAGNOSIS — R7301 Impaired fasting glucose: Secondary | ICD-10-CM

## 2019-01-01 DIAGNOSIS — M159 Polyosteoarthritis, unspecified: Secondary | ICD-10-CM

## 2019-01-01 DIAGNOSIS — M170 Bilateral primary osteoarthritis of knee: Secondary | ICD-10-CM

## 2019-01-01 DIAGNOSIS — E782 Mixed hyperlipidemia: Secondary | ICD-10-CM | POA: Diagnosis not present

## 2019-01-01 LAB — COMPREHENSIVE METABOLIC PANEL
ALT: 25 U/L (ref 0–35)
AST: 24 U/L (ref 0–37)
Albumin: 4.6 g/dL (ref 3.5–5.2)
Alkaline Phosphatase: 92 U/L (ref 39–117)
BUN: 11 mg/dL (ref 6–23)
CO2: 27 mEq/L (ref 19–32)
Calcium: 9.7 mg/dL (ref 8.4–10.5)
Chloride: 100 mEq/L (ref 96–112)
Creatinine, Ser: 0.77 mg/dL (ref 0.40–1.20)
GFR: 75.62 mL/min (ref >60.00)
Glucose, Bld: 107 mg/dL — ABNORMAL HIGH (ref 70–99)
Potassium: 4 mEq/L (ref 3.5–5.1)
Sodium: 135 mEq/L (ref 135–145)
Total Bilirubin: 0.6 mg/dL (ref 0.2–1.2)
Total Protein: 7.2 g/dL (ref 6.0–8.3)

## 2019-01-01 LAB — CBC WITH DIFFERENTIAL/PLATELET
Basophils Absolute: 0 10*3/uL (ref 0.0–0.1)
Basophils Relative: 0.7 % (ref 0.0–3.0)
Eosinophils Absolute: 0.1 10*3/uL (ref 0.0–0.7)
Eosinophils Relative: 2.1 % (ref 0.0–5.0)
HCT: 40.7 % (ref 36.0–46.0)
Hemoglobin: 13.6 g/dL (ref 12.0–15.0)
Lymphocytes Relative: 26.3 % (ref 12.0–46.0)
Lymphs Abs: 1.3 10*3/uL (ref 0.7–4.0)
MCHC: 33.5 g/dL (ref 30.0–36.0)
MCV: 91.5 fl (ref 78.0–100.0)
Monocytes Absolute: 0.5 10*3/uL (ref 0.1–1.0)
Monocytes Relative: 9.1 % (ref 3.0–12.0)
Neutro Abs: 3.1 10*3/uL (ref 1.4–7.7)
Neutrophils Relative %: 61.8 % (ref 43.0–77.0)
Platelets: 274 10*3/uL (ref 150.0–400.0)
RBC: 4.45 Mil/uL (ref 3.87–5.11)
RDW: 13.9 % (ref 11.5–15.5)
WBC: 5 10*3/uL (ref 4.0–10.5)

## 2019-01-01 LAB — LIPID PANEL
Cholesterol: 191 mg/dL (ref 0–200)
HDL: 48.3 mg/dL (ref >39.00)
LDL Cholesterol: 116 mg/dL — ABNORMAL HIGH (ref 0–99)
NonHDL: 142.21
Total CHOL/HDL Ratio: 4
Triglycerides: 129 mg/dL (ref 0.0–149.0)
VLDL: 25.8 mg/dL (ref 0.0–40.0)

## 2019-01-01 LAB — TSH: TSH: 0.81 u[IU]/mL (ref 0.35–4.50)

## 2019-01-01 LAB — HEMOGLOBIN A1C: Hgb A1c MFr Bld: 5.9 % (ref 4.6–6.5)

## 2019-01-01 MED ORDER — TRIAMCINOLONE ACETONIDE 0.1 % EX CREA: TOPICAL_CREAM | CUTANEOUS | 3 refills | Status: DC | PRN

## 2019-01-01 MED ORDER — DICLOFENAC SODIUM 1 % TD GEL: 4.0000 g | Freq: Four times a day (QID) | TRANSDERMAL | 2 refills | Status: AC | PRN

## 2019-01-01 NOTE — Progress Notes (Signed)
Subjective  Chief Complaint  Patient presents with  . Annual Exam    She is fasting, has not had mammogram. GYN did give order to have done.. Wants to discuss Shingrex vaccine   . Hypertension    HPI: Melissa Bryant is a 63 y.o. female who presents to [facility] today for a Female Wellness Visit. She also has the concerns and/or needs as listed above in the chief complaint. These will be addressed in addition to the Health Maintenance Visit.   Wellness Visit: annual visit with health maintenance review and exam without Pap   HM: Gyn exam a few weeks ago. Pt to set up mammogram appt. crc screen is up to date. Feels well. Has gained a little weight due to less activity but working on losing now. shingrix candidate.  Chronic disease f/u and/or acute problem visit: (deemed necessary to be done in addition to the wellness visit):  HTN: doing well with meds. No AE. Feeling well. Taking medications w/o adverse effects. No symptoms of CHF, angina; no palpitations, sob, cp or lower extremity edema. Compliant with meds.   hld on statin and stable. Due for recheck. No aes.   Has bilateral OA knees with occ pain. Uses otc meds. H/o left knee arthroscopy.   H/o IFG w/o sxs of hyperglycemia  Assessment  1. Annual physical exam   2. Mixed hyperlipidemia   3. Essential hypertension   4. Primary osteoarthritis involving multiple joints   5. History of colon polyps - benign, q 75yr   6. Impaired fasting glucose   7. Primary osteoarthritis of both knees   8. Need for shingles vaccine      Plan  Female Wellness Visit:  Age appropriate Health Maintenance and Prevention measures were discussed with patient. Included topics are cancer screening recommendations, ways to keep healthy (see AVS) including dietary and exercise recommendations, regular eye and dental care, use of seat belts, and avoidance of moderate alcohol use and tobacco use. Pt to schedule mammogram  BMI:  discussed patient's BMI and encouraged positive lifestyle modifications to help get to or maintain a target BMI.  HM needs and immunizations were addressed and ordered. See below for orders. See HM and immunization section for updates. shingrix #1 today given.  Routine labs and screening tests ordered including cmp, cbc and lipids where appropriate.  Discussed recommendations regarding Vit D and calcium supplementation (see AVS)  Chronic disease management visit and/or acute problem visit:  HTN is well controlled. No med changes. Check renal function and electrolytes.  HLD on statin. Recheck panel and lfts  Monitoring a1c   rec weight loss  OA: discussed tx options. Trial of voltaren gel. Mild.  Follow up: 6 months for htn f/u   Orders Placed This Encounter  Procedures  . Varicella-zoster vaccine IM  . CBC with Differential/Platelet  . Comprehensive metabolic panel  . Lipid panel  . TSH  . Hemoglobin A1c   Meds ordered this encounter  Medications  . triamcinolone cream (KENALOG) 0.1 %    Sig: Apply topically as needed.    Dispense:  45 g    Refill:  3  . diclofenac sodium (VOLTAREN) 1 % GEL    Sig: Apply 4 g topically 4 (four) times daily as needed.    Dispense:  400 g    Refill:  2      Lifestyle: Body mass index is 31.41 kg/m. Wt Readings from Last 3 Encounters:  01/01/19 194 lb 9.6  oz (88.3 kg)  11/19/18 190 lb (86.2 kg)  02/17/18 194 lb 3.2 oz (88.1 kg)     Patient Active Problem List   Diagnosis Date Noted  . Mixed hyperlipidemia 02/11/2009    Priority: High  . Essential hypertension 02/11/2009    Priority: High    Qualifier: Diagnosis of  By: Linna DarnerWyrick, CMA, Cindy     . Osteoarthritis, multiple sites 02/11/2009    Priority: Medium  . History of colon polyps - benign, q 2735yr 02/11/2009    Priority: Low  . Primary osteoarthritis of both knees 01/01/2019  . Adult acne 11/19/2018  . Impaired fasting glucose 11/19/2018   Health Maintenance  Topic  Date Due  . MAMMOGRAM  09/04/2018  . INFLUENZA VACCINE  12/27/2018  . PAP SMEAR-Modifier  11/24/2023  . TETANUS/TDAP  03/30/2024  . COLONOSCOPY  08/07/2026  . Hepatitis C Screening  Completed  . HIV Screening  Completed   Immunization History  Administered Date(s) Administered  . Influenza Split 02/26/2012  . Influenza Whole 02/01/2010  . Influenza,inj,Quad PF,6+ Mos 03/31/2013, 06/08/2015, 02/17/2018  . Influenza-Unspecified 02/26/2016  . Td 05/29/2003, 03/30/2014  . Zoster Recombinat (Shingrix) 01/01/2019   We updated and reviewed the patient's past history in detail and it is documented below. Allergies: Patient is allergic to sulfonamide derivatives. Past Medical History Patient  has a past medical history of Heart murmur, colonic polyps, Hyperlipidemia, Hypertension, and Osteoarthritis. Past Surgical History Patient  has a past surgical history that includes Knee arthroscopy and arthrotomy (2007); [place] and Tubal ligation. Family History: Patient family history includes Alcohol abuse in her father; Arthritis in her brother and brother; Diabetes in her brother and brother; Healthy in her son, son, and son; Hyperlipidemia in her brother and brother; Lung cancer in her mother; Stroke in her maternal grandfather. Social History:  Patient  reports that she has never smoked. She has never used smokeless tobacco. She reports current alcohol use. She reports that she does not use drugs.  Review of Systems: Constitutional: negative for fever or malaise Ophthalmic: negative for photophobia, double vision or loss of vision Cardiovascular: negative for chest pain, dyspnea on exertion, or new LE swelling Respiratory: negative for SOB or persistent cough Gastrointestinal: negative for abdominal pain, change in bowel habits or melena Genitourinary: negative for dysuria or gross hematuria, no abnormal uterine bleeding or disharge Musculoskeletal: negative for  new gait disturbance or muscular weakness Integumentary: negative for new or persistent rashes, no breast lumps Neurological: negative for TIA or stroke symptoms Psychiatric: negative for SI or delusions Allergic/Immunologic: negative for hives  Patient Care Team    Relationship Specialty Notifications Start End  Willow OraAndy,  L, MD PCP - General Family Medicine  08/13/17   Lenn Sinkegal, Norman S, DPM Consulting Physician Podiatry  08/13/17   Sharrell KuMedoff, Jeffrey, MD Consulting Physician Gastroenterology  08/13/17   Gerald Leitzole, Tara, MD Consulting Physician Obstetrics and Gynecology  08/13/17   Sheppard EvensMiller, Gary J  Ophthalmology  08/13/17     Objective  Vitals: BP 138/80   Pulse 70   Temp 98 F (36.7 C) (Tympanic)   Resp 16   Ht 5\' 6"  (1.676 m)   Wt 194 lb 9.6 oz (88.3 kg)   SpO2 98%   BMI 31.41 kg/m  General:  Well developed, well nourished, no acute distress  Psych:  Alert and orientedx3,normal mood and affect HEENT:  Normocephalic, atraumatic, non-icteric sclera, PERRL, oropharynx is clear without mass or exudate, supple neck without adenopathy, mass or thyromegaly  Cardiovascular:  Normal S1, S2, RRR without gallop, rub or murmur, nondisplaced PMI Respiratory:  Good breath sounds bilaterally, CTAB with normal respiratory effort Gastrointestinal: normal bowel sounds, soft, non-tender, no noted masses. No HSM MSK: no deformities, contusions. Joints are without erythema or swelling. OA changes in bilateral knees visible. Spine and CVA region are nontender Skin:  Warm, no rashes or suspicious lesions noted Neurologic:    Mental status is normal. CN 2-11 are normal. Gross motor and sensory exams are normal. Normal gait. No tremor    Commons side effects, risks, benefits, and alternatives for medications and treatment plan prescribed today were discussed, and the patient expressed understanding of the given instructions. Patient is instructed to call or message via MyChart if he/she has any questions or  concerns regarding our treatment plan. No barriers to understanding were identified. We discussed Red Flag symptoms and signs in detail. Patient expressed understanding regarding what to do in case of urgent or emergency type symptoms.   Medication list was reconciled, printed and provided to the patient in AVS. Patient instructions and summary information was reviewed with the patient as documented in the AVS. This note was prepared with assistance of Dragon voice recognition software. Occasional wrong-word or sound-a-like substitutions may have occurred due to the inherent limitations of voice recognition software

## 2019-01-01 NOTE — Patient Instructions (Signed)
Please return in 6 months for follow up of your hypertension. We will give you your 2nd shingrix vaccination at that time as well.   I will release your lab results to you on your MyChart account with further instructions. Please reply with any questions.   Please schedule your mammogram.   Today you were given your first Shingrix vaccination.   If you have any questions or concerns, please don't hesitate to send me a message via MyChart or call the office at [phone]. Thank you for visiting with Melissa Bryant today! It's our pleasure caring for you.   Preventive Care 30-83 Years Old, Female Preventive care refers to visits with your health care provider and lifestyle choices that can promote health and wellness. This includes:  A yearly physical exam. This may also be called an annual well check.  Regular dental visits and eye exams.  Immunizations.  Screening for certain conditions.  Healthy lifestyle choices, such as eating a healthy diet, getting regular exercise, not using drugs or products that contain nicotine and tobacco, and limiting alcohol use. What can I expect for my preventive care visit? Physical exam Your health care provider will check your:  Height and weight. This may be used to calculate body mass index (BMI), which tells if you are at a healthy weight.  Heart rate and blood pressure.  Skin for abnormal spots. Counseling Your health care provider may ask you questions about your:  Alcohol, tobacco, and drug use.  Emotional well-being.  Home and relationship well-being.  Sexual activity.  Eating habits.  Work and work Statistician.  Method of birth control.  Menstrual cycle.  Pregnancy history. What immunizations do I need?  Influenza (flu) vaccine  This is recommended every year. Tetanus, diphtheria, and pertussis (Tdap) vaccine  You may need a Td booster every 10 years. Varicella (chickenpox) vaccine  You may need this if you have not been  vaccinated. Zoster (shingles) vaccine  You may need this after age 26. Measles, mumps, and rubella (MMR) vaccine  You may need at least one dose of MMR if you were born in [birth year] or later. You may also need a second dose. Pneumococcal conjugate (PCV13) vaccine  You may need this if you have certain conditions and were not previously vaccinated. Pneumococcal polysaccharide (PPSV23) vaccine  You may need one or two doses if you smoke cigarettes or if you have certain conditions. Meningococcal conjugate (MenACWY) vaccine  You may need this if you have certain conditions. Hepatitis A vaccine  You may need this if you have certain conditions or if you travel or work in places where you may be exposed to hepatitis A. Hepatitis B vaccine  You may need this if you have certain conditions or if you travel or work in places where you may be exposed to hepatitis B. Haemophilus influenzae type b (Hib) vaccine  You may need this if you have certain conditions. Human papillomavirus (HPV) vaccine  If recommended by your health care provider, you may need three doses over 6 months. You may receive vaccines as individual doses or as more than one vaccine together in one shot (combination vaccines). Talk with your health care provider about the risks and benefits of combination vaccines. What tests do I need? Blood tests  Lipid and cholesterol levels. These may be checked every 5 years, or more frequently if you are over 69 years old.  Hepatitis C test.  Hepatitis B test. Screening  Lung cancer screening. You may have this screening  every year starting at age 66 if you have a 30-pack-year history of smoking and currently smoke or have quit within the past 15 years.  Colorectal cancer screening. All adults should have this screening starting at age 23 and continuing until age 61. Your health care provider may recommend screening at age 68 if you are at increased risk. You will have tests every  1-10 years, depending on your results and the type of screening test.  Diabetes screening. This is done by checking your blood sugar (glucose) after you have not eaten for a while (fasting). You may have this done every 1-3 years.  Mammogram. This may be done every 1-2 years. Talk with your health care provider about when you should start having regular mammograms. This may depend on whether you have a family history of breast cancer.  BRCA-related cancer screening. This may be done if you have a family history of breast, ovarian, tubal, or peritoneal cancers.  Pelvic exam and Pap test. This may be done every 3 years starting at age 33. Starting at age 54, this may be done every 5 years if you have a Pap test in combination with an HPV test. Other tests  Sexually transmitted disease (STD) testing.  Bone density scan. This is done to screen for osteoporosis. You may have this scan if you are at high risk for osteoporosis. Follow these instructions at home: Eating and drinking  Eat a diet that includes fresh fruits and vegetables, whole grains, lean protein, and low-fat dairy.  Take vitamin and mineral supplements as recommended by your health care provider.  Do not drink alcohol if: ? Your health care provider tells you not to drink. ? You are pregnant, may be pregnant, or are planning to become pregnant.  If you drink alcohol: ? Limit how much you have to 0-1 drink a day. ? Be aware of how much alcohol is in your drink. In the [place] one drink equals one 12 oz bottle of beer (355 mL), one 5 oz glass of wine (148 mL), or one 1 oz glass of hard liquor (44 mL). Lifestyle  Take daily care of your teeth and gums.  Stay active. Exercise for at least 30 minutes on 5 or more days each week.  Do not use any products that contain nicotine or tobacco, such as cigarettes, e-cigarettes, and chewing tobacco. If you need help quitting, ask your health care provider.  If you are sexually active,  practice safe sex. Use a condom or other form of birth control (contraception) in order to prevent pregnancy and STIs (sexually transmitted infections).  If told by your health care provider, take low-dose aspirin daily starting at age 84. What's next?  Visit your health care provider once a year for a well check visit.  Ask your health care provider how often you should have your eyes and teeth checked.  Stay up to date on all vaccines. This information is not intended to replace advice given to you by your health care provider. Make sure you discuss any questions you have with your health care provider. Document Released: 06/10/2015 Document Revised: 01/23/2018 Document Reviewed: 01/23/2018 Elsevier Patient Education  2020 [employer].

## 2019-02-16 ENCOUNTER — Ambulatory Visit (INDEPENDENT_AMBULATORY_CARE_PROVIDER_SITE_OTHER): Payer: BC Managed Care – PPO

## 2019-02-16 ENCOUNTER — Other Ambulatory Visit: Payer: Self-pay

## 2019-02-16 DIAGNOSIS — Z23 Encounter for immunization: Secondary | ICD-10-CM

## 2019-02-16 DIAGNOSIS — L720 Epidermal cyst: Secondary | ICD-10-CM | POA: Diagnosis not present

## 2019-03-05 DIAGNOSIS — B078 Other viral warts: Secondary | ICD-10-CM | POA: Diagnosis not present

## 2019-03-05 DIAGNOSIS — L821 Other seborrheic keratosis: Secondary | ICD-10-CM | POA: Diagnosis not present

## 2019-07-01 ENCOUNTER — Other Ambulatory Visit: Payer: Self-pay

## 2019-07-02 ENCOUNTER — Encounter: Payer: Self-pay | Admitting: Family Medicine

## 2019-07-02 ENCOUNTER — Ambulatory Visit (INDEPENDENT_AMBULATORY_CARE_PROVIDER_SITE_OTHER): Payer: BC Managed Care – PPO

## 2019-07-02 ENCOUNTER — Ambulatory Visit (INDEPENDENT_AMBULATORY_CARE_PROVIDER_SITE_OTHER): Payer: BC Managed Care – PPO | Admitting: Family Medicine

## 2019-07-02 ENCOUNTER — Other Ambulatory Visit: Payer: Self-pay | Admitting: Family Medicine

## 2019-07-02 VITALS — BP 136/78 | HR 80 | Temp 97.2°F | Ht 66.0 in | Wt 191.2 lb

## 2019-07-02 DIAGNOSIS — R7301 Impaired fasting glucose: Secondary | ICD-10-CM | POA: Diagnosis not present

## 2019-07-02 DIAGNOSIS — M170 Bilateral primary osteoarthritis of knee: Secondary | ICD-10-CM | POA: Diagnosis not present

## 2019-07-02 DIAGNOSIS — I10 Essential (primary) hypertension: Secondary | ICD-10-CM

## 2019-07-02 DIAGNOSIS — E782 Mixed hyperlipidemia: Secondary | ICD-10-CM

## 2019-07-02 DIAGNOSIS — Z23 Encounter for immunization: Secondary | ICD-10-CM | POA: Diagnosis not present

## 2019-07-02 DIAGNOSIS — Z1231 Encounter for screening mammogram for malignant neoplasm of breast: Secondary | ICD-10-CM

## 2019-07-02 DIAGNOSIS — M1711 Unilateral primary osteoarthritis, right knee: Secondary | ICD-10-CM | POA: Diagnosis not present

## 2019-07-02 NOTE — Patient Instructions (Addendum)
Please return in 6 months for your annual complete physical; please come fasting.  Keep up the good work!  If you have any questions or concerns, please don't hesitate to send me a message via MyChart or call the office at [phone]. Thank you for visiting with [place] today! It's our pleasure caring for you.  Hylan G-F 20 intra-articular injection What is this medicine? HYLAN G-F 20 (HI lan G F 20) is used to treat osteoarthritis of the knee. It lubricates and cushions the joint, reducing pain in the knee. This medicine may be used for other purposes; ask your health care provider or pharmacist if you have questions. COMMON BRAND NAME(S): Synvisc, Synvisc-One What should I tell my health care provider before I take this medicine? They need to know if you have any of these conditions:  severe knee inflammation  skin conditions or sensitivity  skin or joint infection  venous stasis  an unusual or allergic reaction to hylan G-F 20, hyaluronan (sodium hyaluronate), eggs, other medicines, foods, dyes, or preservatives  pregnant or trying to get pregnant  breast-feeding How should I use this medicine? This medicine is for injection into the knee joint. It is given by a health care professional in a hospital or clinic setting. Talk to your pediatrician regarding the use of this medicine in children. This medicine is not approved for use in children. Overdosage: If you think you have taken too much of this medicine contact a poison control center or emergency room at once. NOTE: This medicine is only for you. Do not share this medicine with others. What if I miss a dose? Keep appointments for follow-up doses as directed. For Synvisc, you will need weekly injections for 3 doses. It is important not to miss your dose. If you will receive Synvisc-One, then only 1 injection will be needed. Call your doctor or health care professional if you are unable to keep an appointment. What may interact  with this medicine? Do not take this medicine with any of the following medications:  other injections for the joint like steroids or anesthetics  certain skin disinfectants like benzalkonium chloride This list may not describe all possible interactions. Give your health care provider a list of all the medicines, herbs, non-prescription drugs, or dietary supplements you use. Also tell them if you smoke, drink alcohol, or use illegal drugs. Some items may interact with your medicine. What should I watch for while using this medicine? Tell your doctor or healthcare professional if your symptoms do not start to get better or if they get worse. Your condition will be monitored carefully while you are receiving this medicine. Most persons get pain relief for up to 6 months after treatment. Avoid strenuous activities (high-impact sports, jogging) or major weight-bearing activities for 48 hours after the injection. What side effects may I notice from receiving this medicine? Side effects that you should report to your doctor or health care professional as soon as possible:  allergic reactions like skin rash, itching or hives, swelling of the face, lips, or tongue  difficulty breathing  fever or chills  severe joint pain or swelling  unusual bleeding or bruising Side effects that usually do not require medical attention (report to your doctor or health care professional if they continue or are bothersome):  dizziness  flushing  general ill feeling or flu-like symptoms  headache  minor joint pain or swelling  muscle pain or cramps  pain, redness, irritation or bruising at site of injection This  list may not describe all possible side effects. Call your doctor for medical advice about side effects. You may report side effects to FDA at 1-800-FDA-1088. Where should I keep my medicine? This drug is given in a hospital or clinic and will not be stored at home. NOTE: This sheet is a  summary. It may not cover all possible information. If you have questions about this medicine, talk to your doctor, pharmacist, or health care provider.  2020 Elsevier/Gold Standard (2015-06-16 11:48:41)

## 2019-07-02 NOTE — Progress Notes (Signed)
Subjective  CC:  Chief Complaint  Patient presents with  . Hypertension    patient does not check readings at home. no dizziness, SOB, HA  . Hyperlipidemia    diet is stable. exercises 3-4 times weekly.    HPI: Melissa Bryant is a 64 y.o. female who presents to the office today to address the problems listed above in the chief complaint.  Hypertension f/u: Control is good . Pt reports she is doing well. taking medications as instructed, no medication side effects noted, no TIAs, no chest pain on exertion, no dyspnea on exertion, no swelling of ankles. She denies adverse effects from his BP medications. Compliance with medication is good. Home readings 130/80 on avg.   HLD well controlled on statin  OA knees: bothersome but manages with voltaren gel, tylenol and activity. Hasn't had xrays in a long time. Right knee pain/stiffness is worseing. Rare swelling.   HM: due mammo.   IFG: no sxs of hyperglycemia. Eating better now that holidays are over.   Assessment  1. Essential hypertension   2. Mixed hyperlipidemia   3. Impaired fasting glucose   4. Primary osteoarthritis of both knees   5. Encounter for screening mammogram for breast cancer      Plan    Hypertension f/u: BP control is well controlled. Continue meds. If trending upward, will increase hctz dose.   Hyperlipidemia f/u: slightly worse last year on statin. Will recheck in 6 months and adjust dose of statin if persists  IFG: continue low sugar diet and weight loss.   OA: check xray of right knee. Discussed treatment options: may be a good synvisc candidate IF her current treatment plans are not enough. Pt will return if needed. See AVS.   HM: mammo ordered.  Education regarding management of these chronic disease states was given. Management strategies discussed on successive visits include dietary and exercise recommendations, goals of achieving and maintaining IBW, and lifestyle modifications aiming for adequate  sleep and minimizing stressors.   Follow up: Return in about 6 months (around 12/30/2019) for complete physical, follow up Hypertension.  Orders Placed This Encounter  Procedures  . MM 3D SCREEN BREAST BILATERAL   No orders of the defined types were placed in this encounter.     BP Readings from Last 3 Encounters:  07/02/19 136/78  01/01/19 138/80  11/19/18 128/78   Wt Readings from Last 3 Encounters:  07/02/19 191 lb 3.2 oz (86.7 kg)  01/01/19 194 lb 9.6 oz (88.3 kg)  11/19/18 190 lb (86.2 kg)    Lab Results  Component Value Date   CHOL 191 01/01/2019   CHOL 172 08/13/2017   CHOL 164 06/26/2016   Lab Results  Component Value Date   HDL 48.30 01/01/2019   HDL 50.20 08/13/2017   HDL 50.30 06/26/2016   Lab Results  Component Value Date   LDLCALC 116 (H) 01/01/2019   LDLCALC 81 06/26/2016   LDLCALC 79 05/10/2015   Lab Results  Component Value Date   TRIG 129.0 01/01/2019   TRIG 205.0 (H) 08/13/2017   TRIG 164.0 (H) 06/26/2016   Lab Results  Component Value Date   CHOLHDL 4 01/01/2019   CHOLHDL 3 08/13/2017   CHOLHDL 3 06/26/2016   Lab Results  Component Value Date   LDLDIRECT 104.0 08/13/2017   Lab Results  Component Value Date   CREATININE 0.77 01/01/2019   BUN 11 01/01/2019   NA 135 01/01/2019   K 4.0 01/01/2019   CL 100  01/01/2019   CO2 27 01/01/2019   Lab Results  Component Value Date   HGBA1C 5.9 01/01/2019   HGBA1C 5.8 08/13/2017   HGBA1C 5.7 06/08/2015     The 10-year ASCVD risk score Mikey Bussing DC Jr., et al., 2013) is: 7.1%   Values used to calculate the score:     Age: 76 years     Sex: Female     Is Non-Hispanic African American: No     Diabetic: No     Tobacco smoker: No     Systolic Blood Pressure: 409 mmHg     Is BP treated: Yes     HDL Cholesterol: 48.3 mg/dL     Total Cholesterol: 191 mg/dL  I reviewed the patients updated PMH, FH, and SocHx.    Patient Active Problem List   Diagnosis Date Noted  . Impaired fasting glucose  11/19/2018    Priority: High  . Mixed hyperlipidemia 02/11/2009    Priority: High  . Essential hypertension 02/11/2009    Priority: High  . Primary osteoarthritis of both knees 01/01/2019    Priority: Medium  . Osteoarthritis, multiple sites 02/11/2009    Priority: Medium  . Adult acne 11/19/2018    Priority: Low  . History of colon polyps - benign, q 81yr 02/11/2009    Priority: Low    Allergies: Sulfonamide derivatives  Social History: Patient  reports that she has never smoked. She has never used smokeless tobacco. She reports current alcohol use. She reports that she does not use drugs.  Current Meds  Medication Sig  . b complex vitamins tablet Take 1 tablet by mouth daily.    . clindamycin (CLEOCIN T) 1 % external solution Apply topically 2 (two) times daily.  . diclofenac sodium (VOLTAREN) 1 % GEL Apply 4 g topically 4 (four) times daily as needed.  [name] ibuprofen (ADVIL,MOTRIN) 200 MG tablet Take 200 mg by mouth as needed. Reported on 06/08/2015  . metoprolol succinate (TOPROL-XL) 100 MG 24 hr tablet Take 1 tablet (100 mg total) by mouth daily. Take with or immediately following a meal.  . simvastatin (ZOCOR) 40 MG tablet Take 1 tablet (40 mg total) by mouth at bedtime.  [name] telmisartan-hydrochlorothiazide (MICARDIS HCT) 40-12.5 MG tablet Take 1 tablet by mouth daily.  [name] triamcinolone cream (KENALOG) 0.1 % Apply topically as needed.    Review of Systems: Cardiovascular: negative for chest pain, palpitations, leg swelling, orthopnea Respiratory: negative for SOB, wheezing or persistent cough Gastrointestinal: negative for abdominal pain Genitourinary: negative for dysuria or gross hematuria  Objective  Vitals: BP 136/78 (BP Location: Left Arm, Patient Position: Sitting, Cuff Size: Normal)   Pulse 80   Temp (!) 97.2 F (36.2 C) (Temporal)   Ht 5\' 6"  (1.676 m)   Wt 191 lb 3.2 oz (86.7 kg)   SpO2 96%   BMI 30.86 kg/m  General: no acute distress  Psych:  Alert and  oriented, normal mood and affect HEENT:  Normocephalic, atraumatic, supple neck  Cardiovascular:  RRR without murmur. no edema Respiratory:  Good breath sounds bilaterally, CTAB with normal respiratory effort Right knee: no effusion. From, lateral ttp. Nl gait  Commons side effects, risks, benefits, and alternatives for medications and treatment plan prescribed today were discussed, and the patient expressed understanding of the given instructions. Patient is instructed to call or message via MyChart if he/she has any questions or concerns regarding our treatment plan. No barriers to understanding were identified. We discussed Red Flag symptoms and signs in detail.  Patient expressed understanding regarding what to do in case of urgent or emergency type symptoms.   Medication list was reconciled, printed and provided to the patient in AVS. Patient instructions and summary information was reviewed with the patient as documented in the AVS. This note was prepared with assistance of Dragon voice recognition software. Occasional wrong-word or sound-a-like substitutions may have occurred due to the inherent limitations of voice recognition software  This visit occurred during the SARS-CoV-2 public health emergency.  Safety protocols were in place, including screening questions prior to the visit, additional usage of staff PPE, and extensive cleaning of exam room while observing appropriate contact time as indicated for disinfecting solutions.

## 2019-07-08 ENCOUNTER — Ambulatory Visit: Payer: BC Managed Care – PPO | Admitting: Family Medicine

## 2019-08-26 DIAGNOSIS — L258 Unspecified contact dermatitis due to other agents: Secondary | ICD-10-CM | POA: Diagnosis not present

## 2019-08-26 DIAGNOSIS — L820 Inflamed seborrheic keratosis: Secondary | ICD-10-CM | POA: Diagnosis not present

## 2019-09-03 ENCOUNTER — Encounter: Payer: Self-pay | Admitting: Family Medicine

## 2019-09-29 ENCOUNTER — Telehealth: Payer: Self-pay | Admitting: Family Medicine

## 2019-09-29 ENCOUNTER — Other Ambulatory Visit: Payer: Self-pay

## 2019-09-29 MED ORDER — TELMISARTAN-HCTZ 40-12.5 MG PO TABS: 1.0000 | ORAL_TABLET | Freq: Every day | ORAL | 0 refills | Status: DC

## 2019-09-29 MED ORDER — METOPROLOL SUCCINATE ER 100 MG PO TB24: 100.0000 mg | ORAL_TABLET | Freq: Every day | ORAL | 0 refills | Status: DC

## 2019-09-29 MED ORDER — SIMVASTATIN 40 MG PO TABS: 40.0000 mg | ORAL_TABLET | Freq: Every day | ORAL | 0 refills | Status: DC

## 2019-09-29 NOTE — Telephone Encounter (Signed)
Meds have been sent to pharmacy.  

## 2019-09-29 NOTE — Telephone Encounter (Signed)
Patient has to go out of town to [place] unexpectedly yesterday due to father in law being in the begin stages of passing away.   Patient has forgotten her medication. Patient is requesting 4 doses of the simvastatin, telmisartan, and metoprolol to be sent to [facility], [place]  [phone]

## 2019-11-23 ENCOUNTER — Other Ambulatory Visit: Payer: Self-pay | Admitting: Family Medicine

## 2019-11-25 DIAGNOSIS — Z01419 Encounter for gynecological examination (general) (routine) without abnormal findings: Secondary | ICD-10-CM | POA: Diagnosis not present

## 2020-01-05 ENCOUNTER — Encounter: Payer: BC Managed Care – PPO | Admitting: Family Medicine

## 2020-01-21 ENCOUNTER — Other Ambulatory Visit: Payer: Self-pay | Admitting: Family Medicine

## 2020-01-28 ENCOUNTER — Other Ambulatory Visit: Payer: Self-pay

## 2020-01-28 ENCOUNTER — Encounter: Payer: Self-pay | Admitting: Family Medicine

## 2020-01-28 ENCOUNTER — Ambulatory Visit (INDEPENDENT_AMBULATORY_CARE_PROVIDER_SITE_OTHER): Payer: BC Managed Care – PPO | Admitting: Family Medicine

## 2020-01-28 VITALS — BP 122/70 | HR 57 | Temp 97.9°F | Resp 18 | Ht 66.0 in | Wt 189.2 lb

## 2020-01-28 DIAGNOSIS — I10 Essential (primary) hypertension: Secondary | ICD-10-CM | POA: Diagnosis not present

## 2020-01-28 DIAGNOSIS — E782 Mixed hyperlipidemia: Secondary | ICD-10-CM

## 2020-01-28 DIAGNOSIS — M170 Bilateral primary osteoarthritis of knee: Secondary | ICD-10-CM | POA: Diagnosis not present

## 2020-01-28 DIAGNOSIS — Z23 Encounter for immunization: Secondary | ICD-10-CM

## 2020-01-28 DIAGNOSIS — Z0000 Encounter for general adult medical examination without abnormal findings: Secondary | ICD-10-CM | POA: Diagnosis not present

## 2020-01-28 DIAGNOSIS — R7301 Impaired fasting glucose: Secondary | ICD-10-CM

## 2020-01-28 MED ORDER — METOPROLOL SUCCINATE ER 100 MG PO TB24
100.0000 mg | ORAL_TABLET | Freq: Every day | ORAL | 3 refills | Status: DC
Start: 2020-01-28 — End: 2021-02-02

## 2020-01-28 MED ORDER — TELMISARTAN-HCTZ 40-12.5 MG PO TABS
1.0000 | ORAL_TABLET | Freq: Every day | ORAL | 3 refills | Status: DC
Start: 2020-01-28 — End: 2021-02-02

## 2020-01-28 NOTE — Progress Notes (Signed)
Subjective  Chief Complaint  Patient presents with  . Annual Exam    Fasting labs    HPI: Melissa Bryant is a 64 y.o. female who presents to [facility] [place] today for a Female Wellness Visit. She also has the concerns and/or needs as listed above in the chief complaint. These will be addressed in addition to the Health Maintenance Visit.   Wellness Visit: annual visit with health maintenance review and exam without Pap   HM: Due mammogram. Patient to schedule. Other screens are up-to-date. Vaccination today immunizations up-to-date. Continues healthy lifestyle. Happy with concerns for memory concerns. Chronic disease f/u and/or acute problem visit: (deemed necessary to be done in addition to the wellness visit):  Hypertension f/u: Control is good . Pt reports she is doing well. taking medications as instructed, no medication side effects noted, no TIAs, no chest pain on exertion, no dyspnea on exertion, no swelling of ankles. Doing well on her medications. Weight is stable She denies adverse effects from his BP medications. Compliance with medication is good.   Hyperlipidemia on statin. Last LDL is mildly elevated compared to years prior. Will recheck fasting labs today. Adjust medications if indicated. No adverse effects, no myalgias.  Osteoarthritis: Stable severe DJD with intermittent flares treated with Voltaren gel.  BP Readings from Last 3 Encounters:  07/02/19 136/78  01/01/19 138/80  11/19/18 128/78   Wt Readings from Last 3 Encounters:  07/02/19 191 lb 3.2 oz (86.7 kg)  01/01/19 194 lb 9.6 oz (88.3 kg)  11/19/18 190 lb (86.2 kg)    Lab Results  Component Value Date   CHOL 191 01/01/2019   CHOL 172 08/13/2017   CHOL 164 06/26/2016   Lab Results  Component Value Date   HDL 48.30 01/01/2019   HDL 50.20 08/13/2017   HDL 50.30 06/26/2016   Lab Results  Component Value Date   LDLCALC 116 (H) 01/01/2019   LDLCALC 81 06/26/2016   LDLCALC 79  05/10/2015   Lab Results  Component Value Date   TRIG 129.0 01/01/2019   TRIG 205.0 (H) 08/13/2017   TRIG 164.0 (H) 06/26/2016   Lab Results  Component Value Date   CHOLHDL 4 01/01/2019   CHOLHDL 3 08/13/2017   CHOLHDL 3 06/26/2016   Lab Results  Component Value Date   LDLDIRECT 104.0 08/13/2017   Lab Results  Component Value Date   CREATININE 0.77 01/01/2019   BUN 11 01/01/2019   NA 135 01/01/2019   K 4.0 01/01/2019   CL 100 01/01/2019   CO2 27 01/01/2019    The 10-year ASCVD risk score Denman George DC Jr., et al., 2013) is: 7.8%   Values used to calculate the score:     Age: 35 years     Sex: Female     Is Non-Hispanic African American: No     Diabetic: No     Tobacco smoker: No     Systolic Blood Pressure: 136 mmHg     Is BP treated: Yes     HDL Cholesterol: 48.3 mg/dL     Total Cholesterol: 191 mg/dL   Assessment  1. Annual physical exam   2. Essential hypertension   3. Mixed hyperlipidemia   4. Impaired fasting glucose   5. Primary osteoarthritis of both knees      Plan  Female Wellness Visit:  Age appropriate Health Maintenance and Prevention measures were discussed with patient. Included topics are cancer screening recommendations, ways to keep healthy (see AVS) including  dietary and exercise recommendations, regular eye and dental care, use of seat belts, and avoidance of moderate alcohol use and tobacco use. Patient to get scheduled for her mammogram. Counseling done  BMI: discussed patient's BMI and encouraged positive lifestyle modifications to help get to or maintain a target BMI.  HM needs and immunizations were addressed and ordered. See below for orders. See HM and immunization section for updates. Flu vaccination today  Routine labs and screening tests ordered including cmp, cbc and lipids where appropriate.  Discussed recommendations regarding Vit D and calcium supplementation (see AVS)  Chronic disease management visit and/or acute problem  visit:  Hypertension hyperlipidemia have been well controlled. Recheck lab work today. Continue current medications. Adjust lipid medicine if needed  Osteoarthritis: Stable. Precautions, can consider steroid or Synvisc   Follow up: 6 months for hypertension check Orders Placed This Encounter  Procedures  . Hemoglobin A1c  . COMPLETE METABOLIC PANEL WITH GFR  . CBC with Differential/Platelet  . Lipid panel   No orders of the defined types were placed in this encounter.     Lifestyle: There is no height or weight on file to calculate BMI. Wt Readings from Last 3 Encounters:  07/02/19 191 lb 3.2 oz (86.7 kg)  01/01/19 194 lb 9.6 oz (88.3 kg)  11/19/18 190 lb (86.2 kg)    Patient Active Problem List   Diagnosis Date Noted  . Impaired fasting glucose 11/19/2018    Priority: High  . Mixed hyperlipidemia 02/11/2009    Priority: High  . Essential hypertension 02/11/2009    Priority: High  . Primary osteoarthritis of both knees 01/01/2019    Priority: Medium    Right knee xray: severe OA changes   . Osteoarthritis, multiple sites 02/11/2009    Priority: Medium  . Adult acne 11/19/2018    Priority: Low  . History of colon polyps - benign, q 38yr 02/11/2009    Priority: Low   Health Maintenance  Topic Date Due  . MAMMOGRAM  09/04/2018  . INFLUENZA VACCINE  12/27/2019  . PAP SMEAR-Modifier  11/24/2023  . TETANUS/TDAP  03/30/2024  . COLONOSCOPY  08/07/2026  . COVID-19 Vaccine  Completed  . Hepatitis C Screening  Completed  . HIV Screening  Completed   Immunization History  Administered Date(s) Administered  . Influenza Split 02/26/2012  . Influenza Whole 02/01/2010  . Influenza,inj,Quad PF,6+ Mos 03/31/2013, 06/08/2015, 02/17/2018, 02/16/2019  . Influenza-Unspecified 02/26/2016  . PFIZER SARS-COV-2 Vaccination 08/09/2019, 08/31/2019  . Td 05/29/2003, 03/30/2014  . Zoster Recombinat (Shingrix) 01/01/2019, 07/02/2019   We updated and reviewed the patient's past  history in detail and it is documented below. Allergies: Patient is allergic to sulfonamide derivatives. Past Medical History Patient  has a past medical history of Heart murmur, colonic polyps, Hyperlipidemia, Hypertension, and Osteoarthritis. Past Surgical History Patient  has a past surgical history that includes Knee arthroscopy and arthrotomy (2007); Colonoscopy (2009 in [place], [place].); and Tubal ligation. Family History: Patient family history includes Alcohol abuse in her father; Arthritis in her brother and brother; Diabetes in her brother and brother; Healthy in her son, son, and son; Hyperlipidemia in her brother and brother; Lung cancer in her mother; Stroke in her maternal grandfather. Social History:  Patient  reports that she has never smoked. She has never used smokeless tobacco. She reports current alcohol use. She reports that she does not use drugs.  Review of Systems: Constitutional: negative for fever or malaise Ophthalmic: negative for photophobia, double vision or loss of vision  Cardiovascular: negative for chest pain, dyspnea on exertion, or new LE swelling Respiratory: negative for SOB or persistent cough Gastrointestinal: negative for abdominal pain, change in bowel habits or melena Genitourinary: negative for dysuria or gross hematuria, no abnormal uterine bleeding or disharge Musculoskeletal: negative for new gait disturbance or muscular weakness Integumentary: negative for new or persistent rashes, no breast lumps Neurological: negative for TIA or stroke symptoms Psychiatric: negative for SI or delusions Allergic/Immunologic: negative for hives  Patient Care Team    Relationship Specialty Notifications Start End  Willow Ora, MD PCP - General Family Medicine  08/13/17   Lenn Sink, DPM Consulting Physician Podiatry  08/13/17   Sharrell Ku, MD Consulting Physician Gastroenterology  08/13/17   Gerald Leitz, MD Consulting Physician Obstetrics and  Gynecology  08/13/17   Sheppard Evens  Ophthalmology  08/13/17     Objective  Vitals: There were no vitals taken for this visit. General:  Well developed, well nourished, no acute distress  Psych:  Alert and orientedx3,normal mood and affect HEENT:  Normocephalic, atraumatic, non-icteric sclera,  supple neck without adenopathy, mass or thyromegaly Cardiovascular:  Normal S1, S2, RRR without gallop, rub or murmur Respiratory:  Good breath sounds bilaterally, CTAB with normal respiratory effort Gastrointestinal: normal bowel sounds, soft, non-tender, no noted masses. No HSM MSK: no deformities, contusions. Joints are without erythema or swelling.  Skin:  Warm, no rashes or suspicious lesions noted Neurologic:    Mental status is normal. CN 2-11 are normal. Gross motor and sensory exams are normal. Normal gait. No tremor Breast Exam: No mass, skin retraction or nipple discharge is appreciated in either breast. No axillary adenopathy. Fibrocystic changes are not noted    Commons side effects, risks, benefits, and alternatives for medications and treatment plan prescribed today were discussed, and the patient expressed understanding of the given instructions. Patient is instructed to call or message via MyChart if he/she has any questions or concerns regarding our treatment plan. No barriers to understanding were identified. We discussed Red Flag symptoms and signs in detail. Patient expressed understanding regarding what to do in case of urgent or emergency type symptoms.   Medication list was reconciled, printed and provided to the patient in AVS. Patient instructions and summary information was reviewed with the patient as documented in the AVS. This note was prepared with assistance of Dragon voice recognition software. Occasional wrong-word or sound-a-like substitutions may have occurred due to the inherent limitations of voice recognition software  This visit occurred during the SARS-CoV-2  public health emergency.  Safety protocols were in place, including screening questions prior to the visit, additional usage of staff PPE, and extensive cleaning of exam room while observing appropriate contact time as indicated for disinfecting solutions.

## 2020-01-28 NOTE — Patient Instructions (Addendum)
Please return in 6 months for hypertension follow up.  I will release your lab results to you on your MyChart account with further instructions. Please reply with any questions.  I've refilled your blood pressure medications. I'll refill your cholesterol medication once the lab results are back.   If you have any questions or concerns, please don't hesitate to send me a message via MyChart or call the office at [phone]. Thank you for visiting with [place] today! It's our pleasure caring for you.  I have ordered a mammogram and/or bone density for you as we discussed today: [x]   Mammogram  []   Bone Density  Please call the office checked below to schedule your appointment: Your appointment will at the following location  [x]   [facility]      [address] [place],        [address] Wing         []   [facility]  [address] [place],  [facility]  Make sure to wear two peace clothing  No lotions powders or deodorants the day of the appointment Make sure to bring picture ID and insurance card.  Bring list of medications you are currently taking including any supplements.

## 2020-01-29 LAB — COMPLETE METABOLIC PANEL WITH GFR
AG Ratio: 1.8 (calc) (ref 1.0–2.5)
ALT: 27 U/L (ref 6–29)
AST: 29 U/L (ref 10–35)
Albumin: 4.6 g/dL (ref 3.6–5.1)
Alkaline phosphatase (APISO): 94 U/L (ref 37–153)
BUN: 13 mg/dL (ref 7–25)
CO2: 26 mmol/L (ref 20–32)
Calcium: 9.6 mg/dL (ref 8.6–10.4)
Chloride: 103 mmol/L (ref 98–110)
Creat: 0.83 mg/dL (ref 0.50–0.99)
GFR, Est African American: 86 mL/min/{1.73_m2} (ref >=60)
GFR, Est Non African American: 75 mL/min/{1.73_m2} (ref >=60)
Globulin: 2.6 g/dL (calc) (ref 1.9–3.7)
Glucose, Bld: 104 mg/dL — ABNORMAL HIGH (ref 65–99)
Potassium: 4.3 mmol/L (ref 3.5–5.3)
Sodium: 139 mmol/L (ref 135–146)
Total Bilirubin: 0.6 mg/dL (ref 0.2–1.2)
Total Protein: 7.2 g/dL (ref 6.1–8.1)

## 2020-01-29 LAB — CBC WITH DIFFERENTIAL/PLATELET
Absolute Monocytes: 499 cells/uL (ref 200–950)
Basophils Absolute: 38 cells/uL (ref 0–200)
Basophils Relative: 0.8 %
Eosinophils Absolute: 110 cells/uL (ref 15–500)
Eosinophils Relative: 2.3 %
HCT: 41.7 % (ref 35.0–45.0)
Hemoglobin: 13.9 g/dL (ref 11.7–15.5)
Lymphs Abs: 1358 cells/uL (ref 850–3900)
MCH: 31 pg (ref 27.0–33.0)
MCHC: 33.3 g/dL (ref 32.0–36.0)
MCV: 92.9 fL (ref 80.0–100.0)
MPV: 10.2 fL (ref 7.5–12.5)
Monocytes Relative: 10.4 %
Neutro Abs: 2794 cells/uL (ref 1500–7800)
Neutrophils Relative %: 58.2 %
Platelets: 277 10*3/uL (ref 140–400)
RBC: 4.49 10*6/uL (ref 3.80–5.10)
RDW: 13.1 % (ref 11.0–15.0)
Total Lymphocyte: 28.3 %
WBC: 4.8 10*3/uL (ref 3.8–10.8)

## 2020-01-29 LAB — LIPID PANEL
Cholesterol: 175 mg/dL (ref <200)
HDL: 54 mg/dL (ref >=50)
LDL Cholesterol (Calc): 101 mg/dL (calc) — ABNORMAL HIGH
Non-HDL Cholesterol (Calc): 121 mg/dL (calc) (ref <130)
Total CHOL/HDL Ratio: 3.2 (calc) (ref <5.0)
Triglycerides: 107 mg/dL (ref <150)

## 2020-01-29 LAB — HEMOGLOBIN A1C
Hgb A1c MFr Bld: 5.6 % of total Hgb (ref <5.7)
Mean Plasma Glucose: 114 (calc)
eAG (mmol/L): 6.3 (calc)

## 2020-02-10 ENCOUNTER — Other Ambulatory Visit: Payer: Self-pay

## 2020-02-10 ENCOUNTER — Ambulatory Visit
Admission: RE | Admit: 2020-02-10 | Discharge: 2020-02-10 | Disposition: A | Discharge status: Home / Self Care | Payer: BC Managed Care – PPO | Source: Ambulatory Visit | Attending: Family Medicine | Admitting: Family Medicine

## 2020-02-10 DIAGNOSIS — Z1231 Encounter for screening mammogram for malignant neoplasm of breast: Secondary | ICD-10-CM | POA: Diagnosis not present

## 2020-03-22 ENCOUNTER — Encounter: Payer: Self-pay | Admitting: Family Medicine

## 2020-04-23 ENCOUNTER — Other Ambulatory Visit: Payer: Self-pay | Admitting: Family Medicine

## 2020-07-29 ENCOUNTER — Other Ambulatory Visit: Payer: Self-pay

## 2020-07-29 ENCOUNTER — Ambulatory Visit: Payer: BC Managed Care – PPO | Admitting: Family Medicine

## 2020-07-29 ENCOUNTER — Encounter: Payer: Self-pay | Admitting: Family Medicine

## 2020-07-29 VITALS — BP 130/82 | HR 71 | Temp 97.2°F | Wt 180.4 lb

## 2020-07-29 DIAGNOSIS — I10 Essential (primary) hypertension: Secondary | ICD-10-CM

## 2020-07-29 DIAGNOSIS — E782 Mixed hyperlipidemia: Secondary | ICD-10-CM

## 2020-07-29 DIAGNOSIS — E669 Obesity, unspecified: Secondary | ICD-10-CM | POA: Diagnosis not present

## 2020-07-29 DIAGNOSIS — R7301 Impaired fasting glucose: Secondary | ICD-10-CM

## 2020-07-29 MED ORDER — SIMVASTATIN 40 MG PO TABS
ORAL_TABLET | ORAL | 1 refills | Status: DC
Start: 2020-07-29 — End: 2020-10-11

## 2020-07-29 NOTE — Patient Instructions (Addendum)
Please return in 6 months for your annual complete physical; please come fasting.  Congratulations!  Wt Readings from Last 3 Encounters:  07/29/20 180 lb 6.4 oz (81.8 kg)  01/28/20 189 lb 3.2 oz (85.8 kg)  07/02/19 191 lb 3.2 oz (86.7 kg)    If you have any questions or concerns, please don't hesitate to send me a message via MyChart or call the office at [phone]. Thank you for visiting with Melissa Bryant today! It's our pleasure caring for you.

## 2020-07-29 NOTE — Progress Notes (Signed)
Subjective  CC:  Chief Complaint  Patient presents with  . Hypertension    Denies checking BP at home     HPI: Melissa Bryant is a 65 y.o. female who presents to the office today to address the problems listed above in the chief complaint.  Hypertension f/u: Control is good . Pt reports she is doing well. taking medications as instructed, no medication side effects noted, no TIAs, no chest pain on exertion, no dyspnea on exertion, no swelling of ankles.  She joined [employer] and is down 10 pounds.  She is happy about this.  Feeling better overall denies adverse effects from his BP medications. Compliance with medication is good.  He has noticed a few episodes of lightheadedness upon getting out of bed in the morning last few weeks.  She has a blood pressure monitor at home but has not checking it.  Wonders if her blood pressure is running a little low at times.  She keeps well-hydrated.  Hyperlipidemia on statin: Continue to be well controlled.  Is eating a better diet.  Weight loss as noted above.  She would be interested in coming up with patients at some point if able.  Obesity: BMI now less than 30.  Praised for dietary changes.  IFG: Fasting sugar was just above normal last September with normal A1c. Lab Results  Component Value Date   HGBA1C 5.6 01/28/2020   HGBA1C 5.9 01/01/2019   HGBA1C 5.8 08/13/2017     Assessment  1. Essential hypertension   2. Mixed hyperlipidemia   3. Obesity (BMI 30-39.9)   4. Impaired fasting glucose      Plan    Hypertension f/u: BP control is well controlled.  Patient to check her home blood pressure readings when she is feeling lightheaded.  She will notify me if they are running low.  Continue current medications Toprol-XL 100 daily and Micardis HCT 40/12.5 mg daily  Hyperlipidemia f/u: Stable on statin.  Well-tolerated.  Refill simvastatin 40 mg nightly.  Obesity: Resolving with weight watchers diet and weight loss.   Continue.  Impaired fasting glucose: Likely close office as well with weight loss.  Recheck in 6 months. Education regarding management of these chronic disease states was given. Management strategies discussed on successive visits include dietary and exercise recommendations, goals of achieving and maintaining IBW, and lifestyle modifications aiming for adequate sleep and minimizing stressors.   Follow up: 6 months for physical, follow-up hyperlipidemia, hypertension and impaired fasting glucose  No orders of the defined types were placed in this encounter.  Meds ordered this encounter  Medications  . simvastatin (ZOCOR) 40 MG tablet    Sig: TAKE 1 TABLET(40 MG) BY MOUTH AT BEDTIME    Dispense:  90 tablet    Refill:  1      BP Readings from Last 3 Encounters:  07/29/20 130/82  01/28/20 122/70  07/02/19 136/78   Wt Readings from Last 3 Encounters:  07/29/20 180 lb 6.4 oz (81.8 kg)  01/28/20 189 lb 3.2 oz (85.8 kg)  07/02/19 191 lb 3.2 oz (86.7 kg)    Lab Results  Component Value Date   CHOL 175 01/28/2020   CHOL 191 01/01/2019   CHOL 172 08/13/2017   Lab Results  Component Value Date   HDL 54 01/28/2020   HDL 48.30 01/01/2019   HDL 50.20 08/13/2017   Lab Results  Component Value Date   LDLCALC 101 (H) 01/28/2020   LDLCALC 116 (H) 01/01/2019   LDLCALC  81 06/26/2016   Lab Results  Component Value Date   TRIG 107 01/28/2020   TRIG 129.0 01/01/2019   TRIG 205.0 (H) 08/13/2017   Lab Results  Component Value Date   CHOLHDL 3.2 01/28/2020   CHOLHDL 4 01/01/2019   CHOLHDL 3 08/13/2017   Lab Results  Component Value Date   LDLDIRECT 104.0 08/13/2017   Lab Results  Component Value Date   CREATININE 0.83 01/28/2020   BUN 13 01/28/2020   NA 139 01/28/2020   K 4.3 01/28/2020   CL 103 01/28/2020   CO2 26 01/28/2020    The 10-year ASCVD risk score Denman George DC Jr., et al., 2013) is: 6.5%   Values used to calculate the score:     Age: 70 years     Sex: Female      Is Non-Hispanic African American: No     Diabetic: No     Tobacco smoker: No     Systolic Blood Pressure: 130 mmHg     Is BP treated: Yes     HDL Cholesterol: 54 mg/dL     Total Cholesterol: 175 mg/dL  I reviewed the patients updated PMH, FH, and SocHx.    Patient Active Problem List   Diagnosis Date Noted  . Impaired fasting glucose 11/19/2018    Priority: High  . Mixed hyperlipidemia 02/11/2009    Priority: High  . Essential hypertension 02/11/2009    Priority: High  . Primary osteoarthritis of both knees 01/01/2019    Priority: Medium  . Osteoarthritis, multiple sites 02/11/2009    Priority: Medium  . Adult acne 11/19/2018    Priority: Low  . History of colon polyps - benign, q 29yr 02/11/2009    Priority: Low    Allergies: Sulfonamide derivatives  Social History: Patient  reports that she has never smoked. She has never used smokeless tobacco. She reports current alcohol use. She reports that she does not use drugs.  Current Meds  Medication Sig  . b complex vitamins tablet Take 1 tablet by mouth daily.  . diclofenac sodium (VOLTAREN) 1 % GEL Apply 4 g topically 4 (four) times daily as needed.  [name] ibuprofen (ADVIL,MOTRIN) 200 MG tablet Take 200 mg by mouth as needed. Reported on 06/08/2015  . metoprolol succinate (TOPROL-XL) 100 MG 24 hr tablet Take 1 tablet (100 mg total) by mouth daily. Take with or immediately following a meal.  . telmisartan-hydrochlorothiazide (MICARDIS HCT) 40-12.5 MG tablet Take 1 tablet by mouth daily.  [name] triamcinolone cream (KENALOG) 0.1 % Apply topically as needed.  . [DISCONTINUED] simvastatin (ZOCOR) 40 MG tablet TAKE 1 TABLET(40 MG) BY MOUTH AT BEDTIME    Review of Systems: Cardiovascular: negative for chest pain, palpitations, leg swelling, orthopnea Respiratory: negative for SOB, wheezing or persistent cough Gastrointestinal: negative for abdominal pain Genitourinary: negative for dysuria or gross hematuria  Objective  Vitals: BP  130/82   Pulse 71   Temp (!) 97.2 F (36.2 C) (Temporal)   Wt 180 lb 6.4 oz (81.8 kg)   SpO2 97%   BMI 29.12 kg/m  General: no acute distress  Psych:  Alert and oriented, normal mood and affect HEENT:  Normocephalic, atraumatic, supple neck  Cardiovascular:  RRR without murmur. no edema Respiratory:  Good breath sounds bilaterally, CTAB with normal respiratory effort   Commons side effects, risks, benefits, and alternatives for medications and treatment plan prescribed today were discussed, and the patient expressed understanding of the given instructions. Patient is instructed to call or message via  MyChart if he/she has any questions or concerns regarding our treatment plan. No barriers to understanding were identified. We discussed Red Flag symptoms and signs in detail. Patient expressed understanding regarding what to do in case of urgent or emergency type symptoms.   Medication list was reconciled, printed and provided to the patient in AVS. Patient instructions and summary information was reviewed with the patient as documented in the AVS. This note was prepared with assistance of Dragon voice recognition software. Occasional wrong-word or sound-a-like substitutions may have occurred due to the inherent limitations of voice recognition software  This visit occurred during the SARS-CoV-2 public health emergency.  Safety protocols were in place, including screening questions prior to the visit, additional usage of staff PPE, and extensive cleaning of exam room while observing appropriate contact time as indicated for disinfecting solutions.

## 2020-10-11 ENCOUNTER — Other Ambulatory Visit: Payer: Self-pay | Admitting: Family Medicine

## 2020-12-01 DIAGNOSIS — Z01419 Encounter for gynecological examination (general) (routine) without abnormal findings: Secondary | ICD-10-CM | POA: Diagnosis not present

## 2020-12-05 ENCOUNTER — Other Ambulatory Visit: Payer: Self-pay | Admitting: Family Medicine

## 2020-12-05 DIAGNOSIS — E2839 Other primary ovarian failure: Secondary | ICD-10-CM

## 2021-01-30 ENCOUNTER — Encounter: Payer: BC Managed Care – PPO | Admitting: Family Medicine

## 2021-02-01 ENCOUNTER — Encounter: Payer: BC Managed Care – PPO | Admitting: Family Medicine

## 2021-02-02 ENCOUNTER — Ambulatory Visit (INDEPENDENT_AMBULATORY_CARE_PROVIDER_SITE_OTHER): Payer: BC Managed Care – PPO | Admitting: Family Medicine

## 2021-02-02 ENCOUNTER — Encounter: Payer: Self-pay | Admitting: Family Medicine

## 2021-02-02 ENCOUNTER — Other Ambulatory Visit: Payer: Self-pay

## 2021-02-02 VITALS — BP 128/70 | HR 66 | Temp 98.7°F | Ht 66.0 in | Wt 184.8 lb

## 2021-02-02 DIAGNOSIS — M8949 Other hypertrophic osteoarthropathy, multiple sites: Secondary | ICD-10-CM | POA: Diagnosis not present

## 2021-02-02 DIAGNOSIS — E782 Mixed hyperlipidemia: Secondary | ICD-10-CM | POA: Diagnosis not present

## 2021-02-02 DIAGNOSIS — M25511 Pain in right shoulder: Secondary | ICD-10-CM | POA: Diagnosis not present

## 2021-02-02 DIAGNOSIS — Z0001 Encounter for general adult medical examination with abnormal findings: Secondary | ICD-10-CM | POA: Diagnosis not present

## 2021-02-02 DIAGNOSIS — Z86010 Personal history of colonic polyps: Secondary | ICD-10-CM | POA: Diagnosis not present

## 2021-02-02 DIAGNOSIS — R7301 Impaired fasting glucose: Secondary | ICD-10-CM

## 2021-02-02 DIAGNOSIS — Z0000 Encounter for general adult medical examination without abnormal findings: Secondary | ICD-10-CM

## 2021-02-02 DIAGNOSIS — M159 Polyosteoarthritis, unspecified: Secondary | ICD-10-CM

## 2021-02-02 DIAGNOSIS — I10 Essential (primary) hypertension: Secondary | ICD-10-CM

## 2021-02-02 DIAGNOSIS — G8929 Other chronic pain: Secondary | ICD-10-CM

## 2021-02-02 DIAGNOSIS — Z23 Encounter for immunization: Secondary | ICD-10-CM

## 2021-02-02 LAB — CBC WITH DIFFERENTIAL/PLATELET
Basophils Absolute: 0 10*3/uL (ref 0.0–0.1)
Basophils Relative: 1.1 % (ref 0.0–3.0)
Eosinophils Absolute: 0.1 10*3/uL (ref 0.0–0.7)
Eosinophils Relative: 2.7 % (ref 0.0–5.0)
HCT: 41.2 % (ref 36.0–46.0)
Hemoglobin: 13.8 g/dL (ref 12.0–15.0)
Lymphocytes Relative: 28 % (ref 12.0–46.0)
Lymphs Abs: 1.2 10*3/uL (ref 0.7–4.0)
MCHC: 33.4 g/dL (ref 30.0–36.0)
MCV: 90.8 fl (ref 78.0–100.0)
Monocytes Absolute: 0.5 10*3/uL (ref 0.1–1.0)
Monocytes Relative: 11.5 % (ref 3.0–12.0)
Neutro Abs: 2.5 10*3/uL (ref 1.4–7.7)
Neutrophils Relative %: 56.7 % (ref 43.0–77.0)
Platelets: 262 10*3/uL (ref 150.0–400.0)
RBC: 4.53 Mil/uL (ref 3.87–5.11)
RDW: 14.7 % (ref 11.5–15.5)
WBC: 4.4 10*3/uL (ref 4.0–10.5)

## 2021-02-02 LAB — COMPREHENSIVE METABOLIC PANEL
ALT: 20 U/L (ref 0–35)
AST: 23 U/L (ref 0–37)
Albumin: 4.5 g/dL (ref 3.5–5.2)
Alkaline Phosphatase: 92 U/L (ref 39–117)
BUN: 12 mg/dL (ref 6–23)
CO2: 28 mEq/L (ref 19–32)
Calcium: 9.7 mg/dL (ref 8.4–10.5)
Chloride: 101 mEq/L (ref 96–112)
Creatinine, Ser: 0.85 mg/dL (ref 0.40–1.20)
GFR: 71.88 mL/min (ref >60.00)
Glucose, Bld: 109 mg/dL — ABNORMAL HIGH (ref 70–99)
Potassium: 4.2 mEq/L (ref 3.5–5.1)
Sodium: 137 mEq/L (ref 135–145)
Total Bilirubin: 0.6 mg/dL (ref 0.2–1.2)
Total Protein: 7.4 g/dL (ref 6.0–8.3)

## 2021-02-02 LAB — LIPID PANEL
Cholesterol: 183 mg/dL (ref 0–200)
HDL: 51.8 mg/dL (ref >39.00)
LDL Cholesterol: 98 mg/dL (ref 0–99)
NonHDL: 131.21
Total CHOL/HDL Ratio: 4
Triglycerides: 164 mg/dL — ABNORMAL HIGH (ref 0.0–149.0)
VLDL: 32.8 mg/dL (ref 0.0–40.0)

## 2021-02-02 LAB — TSH: TSH: 1.09 u[IU]/mL (ref 0.35–5.50)

## 2021-02-02 LAB — VITAMIN D 25 HYDROXY (VIT D DEFICIENCY, FRACTURES): VITD: 31.38 ng/mL (ref 30.00–100.00)

## 2021-02-02 LAB — HEMOGLOBIN A1C: Hgb A1c MFr Bld: 5.9 % (ref 4.6–6.5)

## 2021-02-02 MED ORDER — TELMISARTAN-HCTZ 40-12.5 MG PO TABS: 1.0000 | ORAL_TABLET | Freq: Every day | ORAL | 3 refills | Status: DC

## 2021-02-02 MED ORDER — METOPROLOL SUCCINATE ER 100 MG PO TB24: 100.0000 mg | ORAL_TABLET | Freq: Every day | ORAL | 3 refills | Status: DC

## 2021-02-02 NOTE — Patient Instructions (Signed)
Please return in 6 months for hypertension follow up.   I will release your lab results to you on your MyChart account with further instructions. Please reply with any questions.    Today you were given your Prevnar 20 and flu vaccinations.  The prevnar protects your from pneumonia infections.   Please schedule your mammogram. It is due this month.   We will call you to get you an appointment with Sports Medicine to evaluate and treat your right shoulder pain.   If you have any questions or concerns, please don't hesitate to send me a message via MyChart or call the office at [phone]. Thank you for visiting with [place] today! It's our pleasure caring for you.

## 2021-02-02 NOTE — Progress Notes (Signed)
Subjective  Chief Complaint  Patient presents with   Annual Exam   Hypertension   Hyperlipidemia    HPI: Melissa Bryant is a 65 y.o. female who presents to [facility] [place] today for a Female Wellness Visit. She also has the concerns and/or needs as listed above in the chief complaint. These will be addressed in addition to the Health Maintenance Visit.   Wellness Visit: annual visit with health maintenance review and exam without Pap  HM: mammo, pap and colonoscopy all up to date. H/o benign colon polyp. Nl pap and mammo. Dexa scheduled for screening. Eligible for flu and prevnar  20 today. Sees gyn for female wellness Chronic disease f/u and/or acute problem visit: (deemed necessary to be done in addition to the wellness visit): IFG: no sxs of hyperglycemia; she is fasting today. Diet is fair. Weight is up a few pounds.  IDP:OEUMPNT well. Taking medications w/o adverse effects. No symptoms of CHF, angina; no palpitations, sob, cp or lower extremity edema. Compliant with meds. On metoprolol xl 100 daily and micardis hct 40/12.5 daily.  HLD: tolerates simvastatin 40 nightly. Fasting for recheck today OA: c/o knee and neck pain intermittently. Uses advil. But now with right shoulder pain: c/o x 1 year. Now with restricted movement and almost daily aching. No injury. No radicular sxs. No UE weakness. Doesn't use any meds although occ advil for knee pain but doesn't really help shoulder pain. Has h/o cervical DJD. C/o right trap muscle pain as well.   Assessment  1. Annual physical exam   2. Impaired fasting glucose   3. Mixed hyperlipidemia   4. Essential hypertension   5. Primary osteoarthritis involving multiple joints   6. History of colon polyps - benign, q 55yr   7. Chronic right shoulder pain      Plan  Female Wellness Visit: Age appropriate Health Maintenance and Prevention measures were discussed with patient. Included topics are cancer screening  recommendations, ways to keep healthy (see AVS) including dietary and exercise recommendations, regular eye and dental care, use of seat belts, and avoidance of moderate alcohol use and tobacco use. Screens are up to date. Dexa is scheudled. Mammo is due this month BMI: discussed patient's BMI and encouraged positive lifestyle modifications to help get to or maintain a target BMI. HM needs and immunizations were addressed and ordered. See below for orders. See HM and immunization section for updates. Flu and prevnar 20 given today.  Routine labs and screening tests ordered including cmp, cbc and lipids where appropriate. Discussed recommendations regarding Vit D and calcium supplementation (see AVS)  Chronic disease management visit and/or acute problem visit: IFG: low carb diet; avoid processed foods recommended. Recheck today asymptomatic HLD: recheck on simvastatin 40. Has been controlled.  HTN: well controlled. Continue metoprolol xl 100 daily and micardis hct 40/12.5 daily. Check renal function and electrolytes. Shouder pain: djd/rotator cuff with restricted ROM: refer to SM for evaluation/treatment of frozen shoulder.   Follow up: 58mo for HTN and IFG  Orders Placed This Encounter  Procedures   CBC with Differential/Platelet   Comprehensive metabolic panel   Hemoglobin A1c   Lipid panel   TSH   VITAMIN D 25 Hydroxy (Vit-D Deficiency, Fractures)   Ambulatory referral to Sports Medicine   Meds ordered this encounter  Medications   metoprolol succinate (TOPROL-XL) 100 MG 24 hr tablet    Sig: Take 1 tablet (100 mg total) by mouth daily. Take with or immediately following a  meal.    Dispense:  90 tablet    Refill:  3   telmisartan-hydrochlorothiazide (MICARDIS HCT) 40-12.5 MG tablet    Sig: Take 1 tablet by mouth daily.    Dispense:  90 tablet    Refill:  3       Body mass index is 29.83 kg/m. Wt Readings from Last 3 Encounters:  02/02/21 184 lb 12.8 oz (83.8 kg)  07/29/20  180 lb 6.4 oz (81.8 kg)  01/28/20 189 lb 3.2 oz (85.8 kg)     Patient Active Problem List   Diagnosis Date Noted   Impaired fasting glucose 11/19/2018    Priority: High   Mixed hyperlipidemia 02/11/2009    Priority: High   Essential hypertension 02/11/2009    Priority: High   Primary osteoarthritis of both knees 01/01/2019    Priority: Medium    Right knee xray: severe OA changes    Osteoarthritis, multiple sites 02/11/2009    Priority: Medium   Adult acne 11/19/2018    Priority: Low   History of colon polyps - benign, q 73yr 02/11/2009    Priority: Low   Health Maintenance  Topic Date Due   DEXA SCAN  Never done   PNA vac Low Risk Adult (1 of 2 - PCV13) Never done   COVID-19 Vaccine (4 - Booster for Pfizer series) 09/02/2020   INFLUENZA VACCINE  12/26/2020   MAMMOGRAM  02/09/2021   PAP SMEAR-Modifier  11/24/2023   TETANUS/TDAP  03/30/2024   COLONOSCOPY (Pts 45-22yrs Insurance coverage will need to be confirmed)  08/07/2026   Hepatitis C Screening  Completed   HIV Screening  Completed   Zoster Vaccines- Shingrix  Completed   HPV VACCINES  Aged Out   Immunization History  Administered Date(s) Administered   Influenza Split 02/26/2012   Influenza Whole 02/01/2010   Influenza,inj,Quad PF,6+ Mos 03/31/2013, 06/08/2015, 02/17/2018, 02/16/2019, 01/28/2020   Influenza-Unspecified 02/26/2016   PFIZER(Purple Top)SARS-COV-2 Vaccination 08/09/2019, 08/31/2019, 05/04/2020   Td 05/29/2003, 03/30/2014   Zoster Recombinat (Shingrix) 01/01/2019, 07/02/2019   We updated and reviewed the patient's past history in detail and it is documented below. Allergies: Patient is allergic to sulfonamide derivatives. Past Medical History Patient  has a past medical history of Heart murmur, colonic polyps, Hyperlipidemia, Hypertension, and Osteoarthritis. Past Surgical History Patient  has a past surgical history that includes Knee arthroscopy and arthrotomy (2007); Colonoscopy (2009 in  [place], [place] and Tubal ligation. Family History: Patient family history includes Alcohol abuse in her father; Arthritis in her brother and brother; Diabetes in her brother and brother; Healthy in her son, son, and son; Hyperlipidemia in her brother and brother; Lung cancer in her mother; Stroke in her maternal grandfather. Social History:  Patient  reports that she has never smoked. She has never used smokeless tobacco. She reports current alcohol use. She reports that she does not use drugs.  Review of Systems: Constitutional: negative for fever or malaise Ophthalmic: negative for photophobia, double vision or loss of vision Cardiovascular: negative for chest pain, dyspnea on exertion, or new LE swelling Respiratory: negative for SOB or persistent cough Gastrointestinal: negative for abdominal pain, change in bowel habits or melena Genitourinary: negative for dysuria or gross hematuria, no abnormal uterine bleeding or disharge Musculoskeletal: negative for new gait disturbance or muscular weakness, + shouder pain, + knee pain Integumentary: negative for new or persistent rashes, no breast lumps Neurological: negative for TIA or stroke symptoms Psychiatric: negative for SI or delusions Allergic/Immunologic: negative for hives  Patient Care  Team    Relationship Specialty Notifications Start End  Willow Ora, MD PCP - General Family Medicine  08/13/17   Lenn Sink, DPM Consulting Physician Podiatry  08/13/17   Sharrell Ku, MD Consulting Physician Gastroenterology  08/13/17   Gerald Leitz, MD Consulting Physician Obstetrics and Gynecology  08/13/17   Sheppard Evens  Ophthalmology  08/13/17     Objective  Vitals: BP 128/70   Pulse 66   Temp 98.7 F (37.1 C)   Ht 5\' 6"  (1.676 m)   Wt 184 lb 12.8 oz (83.8 kg)   SpO2 98%   BMI 29.83 kg/m  General:  Well developed, well nourished, no acute distress  Psych:  Alert and orientedx3,normal mood and affect HEENT:  Normocephalic,  atraumatic, non-icteric sclera,  supple neck without adenopathy, mass or thyromegaly Cardiovascular:  Normal S1, S2, RRR without gallop, rub or murmur Respiratory:  Good breath sounds bilaterally, CTAB with normal respiratory effort Gastrointestinal: normal bowel sounds, soft, non-tender, no noted masses. No HSM MSK: right shoulder with abduction limited to 90 degrees, decreased int rot and abduction. Non tender. OA changes in hands and knees present. Skin:  Warm, no rashes or suspicious lesions noted Neurologic:    Mental status is normal. CN 2-11 are normal. Gross motor and sensory exams are normal. Normal gait. No tremor   Commons side effects, risks, benefits, and alternatives for medications and treatment plan prescribed today were discussed, and the patient expressed understanding of the given instructions. Patient is instructed to call or message via MyChart if he/she has any questions or concerns regarding our treatment plan. No barriers to understanding were identified. We discussed Red Flag symptoms and signs in detail. Patient expressed understanding regarding what to do in case of urgent or emergency type symptoms.  Medication list was reconciled, printed and provided to the patient in AVS. Patient instructions and summary information was reviewed with the patient as documented in the AVS. This note was prepared with assistance of Dragon voice recognition software. Occasional wrong-word or sound-a-like substitutions may have occurred due to the inherent limitations of voice recognition software  This visit occurred during the SARS-CoV-2 public health emergency.  Safety protocols were in place, including screening questions prior to the visit, additional usage of staff PPE, and extensive cleaning of exam room while observing appropriate contact time as indicated for disinfecting solutions.

## 2021-02-02 NOTE — Addendum Note (Signed)
Addended by: Katharine Look on: 02/02/2021 11:39 AM   Modules accepted: Orders

## 2021-02-06 ENCOUNTER — Other Ambulatory Visit: Payer: Self-pay

## 2021-02-06 ENCOUNTER — Ambulatory Visit: Payer: BC Managed Care – PPO | Admitting: Family Medicine

## 2021-02-06 ENCOUNTER — Ambulatory Visit (INDEPENDENT_AMBULATORY_CARE_PROVIDER_SITE_OTHER): Payer: BC Managed Care – PPO

## 2021-02-06 ENCOUNTER — Ambulatory Visit: Payer: Self-pay

## 2021-02-06 VITALS — BP 140/88 | HR 72 | Ht 66.0 in | Wt 186.4 lb

## 2021-02-06 DIAGNOSIS — G8929 Other chronic pain: Secondary | ICD-10-CM

## 2021-02-06 DIAGNOSIS — M25511 Pain in right shoulder: Secondary | ICD-10-CM

## 2021-02-06 DIAGNOSIS — M19011 Primary osteoarthritis, right shoulder: Secondary | ICD-10-CM

## 2021-02-06 NOTE — Progress Notes (Signed)
I, Melissa Bryant, LAT, ATC acting as a scribe for Melissa Graham, MD.  Subjective:    CC: R shoulder pain  HPI: Pt is a 65 y/o female c/o R shoulder pain ongoing for about a year w/ no known MOI. Pt notes she had been painting about 2 weeks ago and thinks that may have pushed her shoulder over the brink. Pt locates pain to the anterior aspect of the [facility] joint.  She denies any injury history.  Pain has been chronic and ongoing in her shoulder for a while.  She is noted lack of range of motion in the shoulder for a while.  Neck pain: yes- R-side Radiates: yes- sometimes, at night Aggravates: IR, flex-esp overhead Treatments tried: HEP, IBU  Pertinent review of Systems: No fevers or chills  Relevant historical information: Hypertension.  History of knee arthritis.   Objective:    Vitals:   02/06/21 1256  BP: 140/88  Pulse: 72  SpO2: 97%   General: Well Developed, well nourished, and in no acute distress.   MSK: Right shoulder normal-appearing Nontender. Limited range of motion abduction 120 degrees external rotation 30 degrees beyond neutral position internal rotation lumbar spine. Strength intact within limits of motion. Positive impingement testing. Negative Yergason's and speeds test. Pulses capillary fill and sensation are intact distally.  Lab and Radiology Results  Procedure: Real-time Ultrasound Guided Injection of right shoulder glenohumeral joint posterior approach Device: Philips Affiniti 50G Images permanently stored and available for review in PACS Ultrasound evaluation prior to injection reveals intact rotator cuff tendons and degenerative posterior glenohumeral joint with effusion. Verbal informed consent obtained.  Discussed risks and benefits of procedure. Warned about infection bleeding damage to structures skin hypopigmentation and fat atrophy among others. Patient expresses understanding and agreement Time-out conducted.   Noted no overlying erythema,  induration, or other signs of local infection.   Skin prepped in a sterile fashion.   Local anesthesia: Topical Ethyl chloride.   With sterile technique and under real time ultrasound guidance: 40 mg of Kenalog and 2 mL of Marcaine injected into the glenohumeral joint. Fluid seen entering the joint capsule.   Completed without difficulty   Pain immediately resolved suggesting accurate placement of the medication.   Advised to call if fevers/chills, erythema, induration, drainage, or persistent bleeding.   Images permanently stored and available for review in the ultrasound unit.  Impression: Technically successful ultrasound guided injection.    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 AC DJD. No acute fractures. Decreased bone mineral density present. Await formal radiology review    Impression and Recommendations:    Assessment and Plan: 65 y.o. female with right shoulder pain thought to be due to advancing glenohumeral DJD.  Patient also has loose bodies in the shoulder which may be a factor as well.  Plan for steroid injection today.  I am doubtful that physical therapy would help the shoulder pain very much but may help the periscapular pain if needed.  Ultimately I think that Melissa Bryant will likely require a total shoulder replacement at some point in the future.  Certainly can proceed with repeat steroid injection and 3 months or later if needed.[name]  PDMP not reviewed this encounter. Orders Placed This Encounter  Procedures   [place] LIMITED JOINT SPACE STRUCTURES UP RIGHT(NO LINKED CHARGES)    Standing Status:   Future    Number of Occurrences:   1  Standing Expiration Date:   08/06/2021    Order Specific Question:   Reason for Exam (SYMPTOM  OR DIAGNOSIS REQUIRED)    Answer:   right shoulder pain    Order Specific Question:   Preferred imaging location?     Answer:   [facility] Shoulder Right    Standing Status:   Future    Number of Occurrences:   1    Standing Expiration Date:   02/06/2022    Order Specific Question:   Reason for Exam (SYMPTOM  OR DIAGNOSIS REQUIRED)    Answer:   right shoulder pain    Order Specific Question:   Preferred imaging location?    Answer:   [facility]   No orders of the defined types were placed in this encounter.   Discussed warning signs or symptoms. Please see discharge instructions. Patient expresses understanding.   The above documentation has been reviewed and is accurate and complete Melissa Bryant, M.D.

## 2021-02-06 NOTE — Patient Instructions (Addendum)
Thank you for coming in today.   Please get an Xray today before you leave   Please use Voltaren gel (Generic Diclofenac Gel) up to 4x daily for pain as needed.  This is available over-the-counter as both the name brand Voltaren gel and the generic diclofenac gel.   I can do this shot every 3 months if needed.

## 2021-02-08 NOTE — Progress Notes (Signed)
Right shoulder x-ray shows advanced arthritis of the main shoulder joint with loose bodies present in the shoulder joint.  If your shoulder pain does not respond well to the injection or the injection does not last I would recommend that you have a consultation with orthopedic surgery to discuss total shoulder replacement.

## 2021-02-16 ENCOUNTER — Encounter: Payer: Self-pay | Admitting: Family Medicine

## 2021-02-17 ENCOUNTER — Encounter: Payer: Self-pay | Admitting: Family Medicine

## 2021-02-17 ENCOUNTER — Telehealth (INDEPENDENT_AMBULATORY_CARE_PROVIDER_SITE_OTHER): Payer: BC Managed Care – PPO | Admitting: Family Medicine

## 2021-02-17 ENCOUNTER — Other Ambulatory Visit: Payer: Self-pay

## 2021-02-17 VITALS — Temp 99.0°F | Wt 184.0 lb

## 2021-02-17 DIAGNOSIS — U071 COVID-19: Secondary | ICD-10-CM | POA: Diagnosis not present

## 2021-02-17 MED ORDER — MOLNUPIRAVIR EUA 200MG CAPSULE: 4.0000 | ORAL_CAPSULE | Freq: Two times a day (BID) | ORAL | 0 refills | Status: AC

## 2021-02-17 NOTE — Progress Notes (Signed)
Virtual Visit via Video Note  Subjective  CC:  Chief Complaint  Patient presents with   Covid Positive    Tested positive 02/16/2021, fever of 100   Cough    Yellow mucus   Nasal Congestion    Headache from congestion denies pressure     I connected with Bertram Millard on 02/17/21 at 11:30 AM EDT by a video enabled telemedicine application and verified that I am speaking with the correct person using two identifiers. Location patient: Home Location provider: [facility] [address], Office Persons participating in the virtual visit: Shrika Milos, Willow Ora, MD Jolyne Loa CMA  I discussed the limitations of evaluation and management by telemedicine and the availability of in person appointments. The patient expressed understanding and agreed to proceed. HPI: Melissa Bryant is a 65 y.o. female who was contacted today to address the problems listed above in the chief complaint. 66 yo w/ htn and hld has typical mild covid sxs: started abruptly 2 days ago with hoarseness. Now with mild congestion and headache and low grade fever. No cough or sob. No GI sxs. Has been vaccinated and had 3rd boosted 04/2020. Feeling pretty good overall.   Assessment  1. COVID-19      Plan  Covid 19 infection w/ risk factors for severe disease:  counseled on risks/ benefits of antiviral treatment. Pt will monitor her symptoms and if resolve over next 48 hours, she will hold off on meds: however if worsening or persisting she will take the molnupiravir. Supportive care with otc meds in meantime.discussed timing of getting the 4th booster.   I discussed the assessment and treatment plan with the patient. The patient was provided an opportunity to ask questions and all were answered. The patient agreed with the plan and demonstrated an understanding of the instructions.   The patient was advised to call back or seek an in-person evaluation if the symptoms worsen or if the condition  fails to improve as anticipated. Follow up: as scheduled.   08/02/2021  Meds ordered this encounter  Medications   molnupiravir EUA (LAGEVRIO) 200 mg CAPS capsule    Sig: Take 4 capsules (800 mg total) by mouth 2 (two) times daily for 5 days.    Dispense:  40 capsule    Refill:  0      I reviewed the patients updated PMH, FH, and SocHx.    Patient Active Problem List   Diagnosis Date Noted   Impaired fasting glucose 11/19/2018    Priority: High   Mixed hyperlipidemia 02/11/2009    Priority: High   Essential hypertension 02/11/2009    Priority: High   Primary osteoarthritis of both knees 01/01/2019    Priority: Medium   Osteoarthritis, multiple sites 02/11/2009    Priority: Medium   Adult acne 11/19/2018    Priority: Low   History of colon polyps - benign, q 43yr 02/11/2009    Priority: Low   Primary osteoarthritis of right shoulder 02/06/2021   Chronic right shoulder pain 02/06/2021   Current Meds  Medication Sig   b complex vitamins tablet Take 1 tablet by mouth daily.   diclofenac sodium (VOLTAREN) 1 % GEL Apply 4 g topically 4 (four) times daily as needed.   ibuprofen (ADVIL,MOTRIN) 200 MG tablet Take 200 mg by mouth as needed. Reported on 06/08/2015   metoprolol succinate (TOPROL-XL) 100 MG 24 hr tablet Take 1 tablet (100 mg total) by mouth daily. Take with or immediately  following a meal.   molnupiravir EUA (LAGEVRIO) 200 mg CAPS capsule Take 4 capsules (800 mg total) by mouth 2 (two) times daily for 5 days.   simvastatin (ZOCOR) 40 MG tablet TAKE 1 TABLET(40 MG) BY MOUTH AT BEDTIME   telmisartan-hydrochlorothiazide (MICARDIS HCT) 40-12.5 MG tablet Take 1 tablet by mouth daily.   triamcinolone cream (KENALOG) 0.1 % Apply topically as needed.    Allergies: Patient is allergic to sulfonamide derivatives. Family History: Patient family history includes Alcohol abuse in her father; Arthritis in her brother and brother; Diabetes in her brother and brother; Healthy in her  son, son, and son; Hyperlipidemia in her brother and brother; Lung cancer in her mother; Stroke in her maternal grandfather. Social History:  Patient  reports that she has never smoked. She has never used smokeless tobacco. She reports current alcohol use. She reports that she does not use drugs.  Review of Systems: Constitutional: Negative for fever malaise or anorexia Cardiovascular: negative for chest pain Respiratory: negative for SOB or persistent cough Gastrointestinal: negative for abdominal pain  OBJECTIVE Vitals: Temp 99 F (37.2 C) (Tympanic)   Wt 184 lb (83.5 kg)   BMI 29.70 kg/m  General: no acute distress , A&Ox3, looks good  Willow Ora, MD

## 2021-04-18 ENCOUNTER — Other Ambulatory Visit: Payer: Self-pay | Admitting: Family Medicine

## 2021-04-18 DIAGNOSIS — Z1231 Encounter for screening mammogram for malignant neoplasm of breast: Secondary | ICD-10-CM

## 2021-04-28 ENCOUNTER — Other Ambulatory Visit: Payer: Self-pay | Admitting: Family Medicine

## 2021-05-31 ENCOUNTER — Other Ambulatory Visit: Payer: Self-pay

## 2021-05-31 ENCOUNTER — Ambulatory Visit
Admission: RE | Admit: 2021-05-31 | Discharge: 2021-05-31 | Disposition: A | Discharge status: Home / Self Care | Payer: BC Managed Care – PPO | Source: Ambulatory Visit

## 2021-05-31 DIAGNOSIS — Z1231 Encounter for screening mammogram for malignant neoplasm of breast: Secondary | ICD-10-CM

## 2021-06-28 ENCOUNTER — Other Ambulatory Visit: Payer: BC Managed Care – PPO

## 2021-08-02 ENCOUNTER — Ambulatory Visit: Payer: BC Managed Care – PPO | Admitting: Family Medicine

## 2021-08-02 ENCOUNTER — Encounter: Payer: Self-pay | Admitting: Family Medicine

## 2021-08-02 ENCOUNTER — Other Ambulatory Visit: Payer: Self-pay

## 2021-08-02 VITALS — BP 134/72 | HR 70 | Temp 98.2°F | Ht 66.0 in | Wt 187.4 lb

## 2021-08-02 DIAGNOSIS — I10 Essential (primary) hypertension: Secondary | ICD-10-CM | POA: Diagnosis not present

## 2021-08-02 DIAGNOSIS — R7301 Impaired fasting glucose: Secondary | ICD-10-CM | POA: Diagnosis not present

## 2021-08-02 DIAGNOSIS — M19011 Primary osteoarthritis, right shoulder: Secondary | ICD-10-CM

## 2021-08-02 LAB — POCT GLYCOSYLATED HEMOGLOBIN (HGB A1C): Hemoglobin A1C: 5.6 % (ref 4.0–5.6)

## 2021-08-02 NOTE — Progress Notes (Signed)
? ? ?Subjective  ?CC:  ?Chief Complaint  ?Patient presents with  ? Hypertension  ?  6 month f/u; Pt is fasting. No concerns today.   ? ? ?HPI: Melissa Bryant is a 66 y.o. female who presents to the office today to address the problems listed above in the chief complaint. ?Hypertension f/u: Control is good . Pt reports she is doing well. taking medications as instructed, no medication side effects noted, no TIAs, no chest pain on exertion, no dyspnea on exertion, no swelling of ankles. She denies adverse effects from his BP medications. Compliance with medication is good.  ?HLD on statin and tolerating well ?Discussed OA of right shoulder and reviewed SM notes. Steroid injection was helpful and lasted about 4 months.  ?IFG: no sxs of hyperglycemia ? ?Assessment  ?1. Essential hypertension   ?2. Impaired fasting glucose   ?3. Primary osteoarthritis of right shoulder   ? ?  ?Plan  ? ?Hypertension f/u: BP control is well controlled. Continue meds w/o changes: ARB/hct and bb ?Hyperlipidemia f/u: on statin ?IFG: stable.  ?OA: discussed mgt. Stable now with prn meds and rom exercises. To consider repeat steroid injection in future as needed.  ? ?Education regarding management of these chronic disease states was given. Management strategies discussed on successive visits include dietary and exercise recommendations, goals of achieving and maintaining IBW, and lifestyle modifications aiming for adequate sleep and minimizing stressors.  ? ?Follow up: Return in about 6 months (around 02/02/2022) for complete physical. ? ?Orders Placed This Encounter  ?Procedures  ? POCT HgB A1C  ? ?No orders of the defined types were placed in this encounter. ? ?  ? ?BP Readings from Last 3 Encounters:  ?08/02/21 134/72  ?02/06/21 140/88  ?02/02/21 128/70  ? ?Wt Readings from Last 3 Encounters:  ?08/02/21 187 lb 6.4 oz (85 kg)  ?02/17/21 184 lb (83.5 kg)  ?02/06/21 186 lb 6.4 oz (84.6 kg)  ? ? ?Lab Results  ?Component Value Date  ? CHOL 183  02/02/2021  ? CHOL 175 01/28/2020  ? CHOL 191 01/01/2019  ? ?Lab Results  ?Component Value Date  ? HDL 51.80 02/02/2021  ? HDL 54 01/28/2020  ? HDL 48.30 01/01/2019  ? ?Lab Results  ?Component Value Date  ? LDLCALC 98 02/02/2021  ? LDLCALC 101 (H) 01/28/2020  ? LDLCALC 116 (H) 01/01/2019  ? ?Lab Results  ?Component Value Date  ? TRIG 164.0 (H) 02/02/2021  ? TRIG 107 01/28/2020  ? TRIG 129.0 01/01/2019  ? ?Lab Results  ?Component Value Date  ? CHOLHDL 4 02/02/2021  ? CHOLHDL 3.2 01/28/2020  ? CHOLHDL 4 01/01/2019  ? ?Lab Results  ?Component Value Date  ? LDLDIRECT 104.0 08/13/2017  ? ?Lab Results  ?Component Value Date  ? CREATININE 0.85 02/02/2021  ? BUN 12 02/02/2021  ? NA 137 02/02/2021  ? K 4.2 02/02/2021  ? CL 101 02/02/2021  ? CO2 28 02/02/2021  ? ?Lab Results  ?Component Value Date  ? HGBA1C 5.6 08/02/2021  ? HGBA1C 5.9 02/02/2021  ? HGBA1C 5.6 01/28/2020  ?  ? ? ?The 10-year ASCVD risk score (Arnett DK, et al., 2019) is: 8% ?  Values used to calculate the score: ?    Age: 85 years ?    Sex: Female ?    Is Non-Hispanic African American: No ?    Diabetic: No ?    Tobacco smoker: No ?    Systolic Blood Pressure: 134 mmHg ?    Is  BP treated: Yes ?    HDL Cholesterol: 51.8 mg/dL ?    Total Cholesterol: 183 mg/dL ? ?I reviewed the patients updated PMH, FH, and SocHx.  ?  ?Patient Active Problem List  ? Diagnosis Date Noted  ? Impaired fasting glucose 11/19/2018  ?  Priority: High  ? Mixed hyperlipidemia 02/11/2009  ?  Priority: High  ? Essential hypertension 02/11/2009  ?  Priority: High  ? Primary osteoarthritis of right shoulder 02/06/2021  ?  Priority: Medium   ? Primary osteoarthritis of both knees 01/01/2019  ?  Priority: Medium   ? Osteoarthritis, multiple sites 02/11/2009  ?  Priority: Medium   ? Adult acne 11/19/2018  ?  Priority: Low  ? History of colon polyps - benign, q 468yr 02/11/2009  ?  Priority: Low  ? ? ?Allergies: Sulfonamide derivatives ? ?Social History: ?Patient  reports that she has never  smoked. She has never used smokeless tobacco. She reports current alcohol use. She reports that she does not use drugs. ? ?Current Meds  ?Medication Sig  ? Ascorbic Acid (VITAMIN C) 500 MG CAPS See admin instructions.  ? b complex vitamins tablet Take 1 tablet by mouth daily.  ? Biotin 10 MG TABS 1 tablet  ? Cholecalciferol (VITAMIN D) 50 MCG (2000 UT) tablet 1 tablet  ? Elderberry 500 MG CAPS See admin instructions.  ? fexofenadine (ALLEGRA) 180 MG tablet Take 180 mg by mouth daily.  ? ibuprofen (ADVIL,MOTRIN) 200 MG tablet Take 200 mg by mouth as needed. Reported on 06/08/2015  ? metoprolol succinate (TOPROL-XL) 100 MG 24 hr tablet Take 1 tablet (100 mg total) by mouth daily. Take with or immediately following a meal.  ? Multiple Vitamins-Minerals (MULTIVITAMIN ADULTS PO)   ? simvastatin (ZOCOR) 40 MG tablet TAKE 1 TABLET(40 MG) BY MOUTH AT BEDTIME  ? telmisartan-hydrochlorothiazide (MICARDIS HCT) 40-12.5 MG tablet Take 1 tablet by mouth daily.  ? triamcinolone cream (KENALOG) 0.1 % Apply topically as needed.  ? Zinc 100 MG TABS 1 tablet  ? ? ?Review of Systems: ?Cardiovascular: negative for chest pain, palpitations, leg swelling, orthopnea ?Respiratory: negative for SOB, wheezing or persistent cough ?Gastrointestinal: negative for abdominal pain ?Genitourinary: negative for dysuria or gross hematuria ? ?Objective  ?Vitals: BP 134/72   Pulse 70   Temp 98.2 ?F (36.8 ?C) (Temporal)   Ht 5\' 6"  (1.676 m)   Wt 187 lb 6.4 oz (85 kg)   SpO2 94%   BMI 30.25 kg/m?  ?General: no acute distress  ?Psych:  Alert and oriented, normal mood and affect ?Cardiovascular:  RRR without murmur. no edema ?Respiratory:  Good breath sounds bilaterally, CTAB with normal respiratory effortes ?Neurologic:   Mental status is normal ? ?Commons side effects, risks, benefits, and alternatives for medications and treatment plan prescribed today were discussed, and the patient expressed understanding of the given instructions. Patient is  instructed to call or message via MyChart if he/she has any questions or concerns regarding our treatment plan. No barriers to understanding were identified. We discussed Red Flag symptoms and signs in detail. Patient expressed understanding regarding what to do in case of urgent or emergency type symptoms.  ?Medication list was reconciled, printed and provided to the patient in AVS. Patient instructions and summary information was reviewed with the patient as documented in the AVS. ?This note was prepared with assistance of Conservation officer, historic buildingsDragon voice recognition software. Occasional wrong-word or sound-a-like substitutions may have occurred due to the inherent limitations of voice recognition software ? ?This visit  occurred during the SARS-CoV-2 public health emergency.  Safety protocols were in place, including screening questions prior to the visit, additional usage of staff PPE, and extensive cleaning of exam room while observing appropriate contact time as indicated for disinfecting solutions.  ?

## 2021-08-02 NOTE — Patient Instructions (Signed)
Please return in 6 months for your annual complete physical; please come fasting.  ? ?If you have any questions or concerns, please don't hesitate to send me a message via MyChart or call the office at [phone]. Thank you for visiting with [place] today! It's our pleasure caring for you.  ? ?I recommend a bone density test to screen for osteoporosis. You may call the office below to schedule.  ?[]   Mammogram  ?[x]   Bone Density ? ?Please call the office checked below to schedule your appointment: ? ?[x]   [facility]     ? [address]      ? [place],       ? [phone]        ? ?[]   [facility] ? [address] [place]  ? [place], [facility] ? [phone] ? ?

## 2021-08-08 ENCOUNTER — Other Ambulatory Visit: Payer: Self-pay | Admitting: Obstetrics and Gynecology

## 2021-08-08 DIAGNOSIS — E2839 Other primary ovarian failure: Secondary | ICD-10-CM

## 2021-10-12 ENCOUNTER — Other Ambulatory Visit: Payer: Self-pay | Admitting: Family Medicine

## 2021-12-04 ENCOUNTER — Other Ambulatory Visit: Payer: Self-pay | Admitting: Obstetrics and Gynecology

## 2021-12-04 ENCOUNTER — Other Ambulatory Visit (HOSPITAL_COMMUNITY)
Admission: RE | Admit: 2021-12-04 | Discharge: 2021-12-04 | Disposition: A | Discharge status: Home / Self Care | Payer: BC Managed Care – PPO | Source: Ambulatory Visit | Attending: Obstetrics and Gynecology | Admitting: Obstetrics and Gynecology

## 2021-12-04 DIAGNOSIS — Z01419 Encounter for gynecological examination (general) (routine) without abnormal findings: Secondary | ICD-10-CM | POA: Diagnosis not present

## 2021-12-05 LAB — CYTOLOGY - PAP
Comment: NEGATIVE
Diagnosis: NEGATIVE
High risk HPV: NEGATIVE

## 2022-01-09 ENCOUNTER — Other Ambulatory Visit: Payer: Self-pay | Admitting: Family Medicine

## 2022-01-31 ENCOUNTER — Ambulatory Visit
Admission: RE | Admit: 2022-01-31 | Discharge: 2022-01-31 | Disposition: A | Discharge status: Home / Self Care | Payer: BC Managed Care – PPO | Source: Ambulatory Visit | Attending: Obstetrics and Gynecology | Admitting: Obstetrics and Gynecology

## 2022-01-31 DIAGNOSIS — M8589 Other specified disorders of bone density and structure, multiple sites: Secondary | ICD-10-CM | POA: Diagnosis not present

## 2022-01-31 DIAGNOSIS — Z780 Asymptomatic menopausal state: Secondary | ICD-10-CM | POA: Diagnosis not present

## 2022-01-31 DIAGNOSIS — E2839 Other primary ovarian failure: Secondary | ICD-10-CM

## 2022-02-07 ENCOUNTER — Ambulatory Visit (INDEPENDENT_AMBULATORY_CARE_PROVIDER_SITE_OTHER): Payer: BC Managed Care – PPO | Admitting: Family Medicine

## 2022-02-07 ENCOUNTER — Encounter: Payer: Self-pay | Admitting: Family Medicine

## 2022-02-07 VITALS — BP 130/80 | HR 72 | Temp 98.5°F | Ht 66.0 in | Wt 189.4 lb

## 2022-02-07 DIAGNOSIS — M8580 Other specified disorders of bone density and structure, unspecified site: Secondary | ICD-10-CM | POA: Insufficient documentation

## 2022-02-07 DIAGNOSIS — R7301 Impaired fasting glucose: Secondary | ICD-10-CM

## 2022-02-07 DIAGNOSIS — E782 Mixed hyperlipidemia: Secondary | ICD-10-CM | POA: Diagnosis not present

## 2022-02-07 DIAGNOSIS — Z0000 Encounter for general adult medical examination without abnormal findings: Secondary | ICD-10-CM | POA: Diagnosis not present

## 2022-02-07 DIAGNOSIS — I10 Essential (primary) hypertension: Secondary | ICD-10-CM

## 2022-02-07 DIAGNOSIS — Z780 Asymptomatic menopausal state: Secondary | ICD-10-CM

## 2022-02-07 DIAGNOSIS — M19011 Primary osteoarthritis, right shoulder: Secondary | ICD-10-CM

## 2022-02-07 DIAGNOSIS — Z23 Encounter for immunization: Secondary | ICD-10-CM | POA: Diagnosis not present

## 2022-02-07 HISTORY — DX: Other specified disorders of bone density and structure, unspecified site: M85.80

## 2022-02-07 LAB — CBC WITH DIFFERENTIAL/PLATELET
Basophils Absolute: 0 10*3/uL (ref 0.0–0.1)
Basophils Relative: 0.7 % (ref 0.0–3.0)
Eosinophils Absolute: 0.1 10*3/uL (ref 0.0–0.7)
Eosinophils Relative: 2.4 % (ref 0.0–5.0)
HCT: 39.2 % (ref 36.0–46.0)
Hemoglobin: 13.2 g/dL (ref 12.0–15.0)
Lymphocytes Relative: 26.1 % (ref 12.0–46.0)
Lymphs Abs: 1 10*3/uL (ref 0.7–4.0)
MCHC: 33.7 g/dL (ref 30.0–36.0)
MCV: 90.5 fl (ref 78.0–100.0)
Monocytes Absolute: 0.4 10*3/uL (ref 0.1–1.0)
Monocytes Relative: 11.5 % (ref 3.0–12.0)
Neutro Abs: 2.2 10*3/uL (ref 1.4–7.7)
Neutrophils Relative %: 59.3 % (ref 43.0–77.0)
Platelets: 261 10*3/uL (ref 150.0–400.0)
RBC: 4.32 Mil/uL (ref 3.87–5.11)
RDW: 14.2 % (ref 11.5–15.5)
WBC: 3.7 10*3/uL — ABNORMAL LOW (ref 4.0–10.5)

## 2022-02-07 LAB — VITAMIN D 25 HYDROXY (VIT D DEFICIENCY, FRACTURES): VITD: 36.21 ng/mL (ref 30.00–100.00)

## 2022-02-07 LAB — LIPID PANEL
Cholesterol: 153 mg/dL (ref 0–200)
HDL: 54.1 mg/dL (ref >39.00)
LDL Cholesterol: 83 mg/dL (ref 0–99)
NonHDL: 98.73
Total CHOL/HDL Ratio: 3
Triglycerides: 78 mg/dL (ref 0.0–149.0)
VLDL: 15.6 mg/dL (ref 0.0–40.0)

## 2022-02-07 LAB — COMPREHENSIVE METABOLIC PANEL
ALT: 29 U/L (ref 0–35)
AST: 30 U/L (ref 0–37)
Albumin: 4.4 g/dL (ref 3.5–5.2)
Alkaline Phosphatase: 100 U/L (ref 39–117)
BUN: 13 mg/dL (ref 6–23)
CO2: 28 mEq/L (ref 19–32)
Calcium: 9.9 mg/dL (ref 8.4–10.5)
Chloride: 103 mEq/L (ref 96–112)
Creatinine, Ser: 0.79 mg/dL (ref 0.40–1.20)
GFR: 77.92 mL/min (ref >60.00)
Glucose, Bld: 111 mg/dL — ABNORMAL HIGH (ref 70–99)
Potassium: 4.9 mEq/L (ref 3.5–5.1)
Sodium: 140 mEq/L (ref 135–145)
Total Bilirubin: 0.6 mg/dL (ref 0.2–1.2)
Total Protein: 7.5 g/dL (ref 6.0–8.3)

## 2022-02-07 LAB — TSH: TSH: 1.07 u[IU]/mL (ref 0.35–5.50)

## 2022-02-07 LAB — HEMOGLOBIN A1C: Hgb A1c MFr Bld: 5.9 % (ref 4.6–6.5)

## 2022-02-07 MED ORDER — TRIAMCINOLONE ACETONIDE 0.1 % EX CREA: TOPICAL_CREAM | CUTANEOUS | 3 refills | Status: DC | PRN

## 2022-02-07 NOTE — Progress Notes (Signed)
Subjective  Chief Complaint  Patient presents with   Annual Exam    Pt here for Annual exam and is currently fasting     HPI: Melissa Bryant is a 66 y.o. female who presents to [facility] [place] today for a Female Wellness Visit. She also has the concerns and/or needs as listed above in the chief complaint. These will be addressed in addition to the Health Maintenance Visit.   Wellness Visit: annual visit with health maintenance review and exam without Pap  HM: screens are current. Flu shot today Chronic disease f/u and/or acute problem visit: (deemed necessary to be done in addition to the wellness visit): Osteopenia: reviewed dexa from 9/6 with lowest T = -2.2 at forearm, -1.1 at spine.  HTN: Feeling well. Taking medications w/o adverse effects. No symptoms of CHF, angina; no palpitations, sob, cp or lower extremity edema. Compliant with meds.  HLD has been well controlled on statin H/o IFG but eating well w/o sxs of hyperglycemia.  OA: rare pain.  Assessment  1. Annual physical exam   2. Osteopenia after menopause   3. Essential hypertension   4. Mixed hyperlipidemia   5. Impaired fasting glucose   6. Primary osteoarthritis of right shoulder      Plan  Female Wellness Visit: Age appropriate Health Maintenance and Prevention measures were discussed with patient. Included topics are cancer screening recommendations, ways to keep healthy (see AVS) including dietary and exercise recommendations, regular eye and dental care, use of seat belts, and avoidance of moderate alcohol use and tobacco use.  BMI: discussed patient's BMI and encouraged positive lifestyle modifications to help get to or maintain a target BMI. HM needs and immunizations were addressed and ordered. See below for orders. See HM and immunization section for updates. Flu shot today Routine labs and screening tests ordered including cmp, cbc and lipids where appropriate. Discussed  recommendations regarding Vit D and calcium supplementation (see AVS)  Chronic disease management visit and/or acute problem visit: Osteopenia dx reviewed and education given. See avs. Recheck 2 years HTN is controlled continue micardis 40/12.5 and metoprolol xl 100 daily. Check renal function and electrolytes. HLD fasting for recheck on simvastatin 40 nithgly. Nl lfts in past H/o IFG to recheck a1c.   Follow up: 12 mo for cpe and htn recheck. Orders Placed This Encounter  Procedures   CBC with Differential/Platelet   Comprehensive metabolic panel   Hemoglobin A1c   Lipid panel   VITAMIN D 25 Hydroxy (Vit-D Deficiency, Fractures)   TSH   Meds ordered this encounter  Medications   triamcinolone cream (KENALOG) 0.1 %    Sig: Apply topically as needed.    Dispense:  45 g    Refill:  3      Body mass index is 30.57 kg/m. Wt Readings from Last 3 Encounters:  02/07/22 189 lb 6.4 oz (85.9 kg)  08/02/21 187 lb 6.4 oz (85 kg)  02/17/21 184 lb (83.5 kg)     Patient Active Problem List   Diagnosis Date Noted   Impaired fasting glucose 11/19/2018    Priority: High   Mixed hyperlipidemia 02/11/2009    Priority: High   Essential hypertension 02/11/2009    Priority: High   Osteopenia after menopause 02/07/2022    Priority: Medium     DEXA 01/2022 lowest T forearm: -2.2, lumbar -1.1: osteopenia; recheck 2 years.     Primary osteoarthritis of right shoulder 02/06/2021    Priority: Medium  Severe glenohumeral arthritis, right. Dr. Logan Bores. Will need replacement at some point. 2022    Primary osteoarthritis of both knees 01/01/2019    Priority: Medium     Right knee xray: severe OA changes    Osteoarthritis, multiple sites 02/11/2009    Priority: Medium    Adult acne 11/19/2018    Priority: Low   History of colon polyps - benign, q 50yr 02/11/2009    Priority: Low   Health Maintenance  Topic Date Due   INFLUENZA VACCINE  12/26/2021   COVID-19 Vaccine (4 - Pfizer series)  02/23/2022 (Originally 06/29/2020)   MAMMOGRAM  05/31/2022   DEXA SCAN  02/01/2024   TETANUS/TDAP  03/30/2024   COLONOSCOPY (Pts 45-1yrs Insurance coverage will need to be confirmed)  08/07/2026   Pneumonia Vaccine 20+ Years old  Completed   Hepatitis C Screening  Completed   Zoster Vaccines- Shingrix  Completed   HPV VACCINES  Aged Out   Immunization History  Administered Date(s) Administered   Fluad Quad(high Dose 65+) 02/02/2021   Influenza Split 02/26/2012   Influenza Whole 02/01/2010   Influenza,inj,Quad PF,6+ Mos 03/31/2013, 06/08/2015, 02/17/2018, 02/16/2019, 01/28/2020   Influenza-Unspecified 02/26/2016   PFIZER(Purple Top)SARS-COV-2 Vaccination 08/09/2019, 08/31/2019, 05/04/2020   PNEUMOCOCCAL CONJUGATE-20 02/02/2021   Td 05/29/2003, 03/30/2014   Zoster Recombinat (Shingrix) 01/01/2019, 07/02/2019   We updated and reviewed the patient's past history in detail and it is documented below. Allergies: Patient is allergic to sulfonamide derivatives. Past Medical History Patient  has a past medical history of Heart murmur, colonic polyps, Hyperlipidemia, Hypertension, Osteoarthritis, and Osteopenia after menopause (02/07/2022). Past Surgical History Patient  has a past surgical history that includes Knee arthroscopy and arthrotomy (2007); Colonoscopy (2009 in [place], [place].); and Tubal ligation. Family History: Patient family history includes Alcohol abuse in her father; Arthritis in her brother and brother; Diabetes in her brother and brother; Healthy in her son, son, and son; Hyperlipidemia in her brother and brother; Lung cancer in her mother; Stroke in her maternal grandfather. Social History:  Patient  reports that she has never smoked. She has never used smokeless tobacco. She reports current alcohol use. She reports that she does not use drugs.  Review of Systems: Constitutional: negative for fever or malaise Ophthalmic: negative for photophobia, double vision or loss of  vision Cardiovascular: negative for chest pain, dyspnea on exertion, or new LE swelling Respiratory: negative for SOB or persistent cough Gastrointestinal: negative for abdominal pain, change in bowel habits or melena Genitourinary: negative for dysuria or gross hematuria, no abnormal uterine bleeding or disharge Musculoskeletal: negative for new gait disturbance or muscular weakness Integumentary: negative for new or persistent rashes, no breast lumps Neurological: negative for TIA or stroke symptoms Psychiatric: negative for SI or delusions Allergic/Immunologic: negative for hives  Patient Care Team    Relationship Specialty Notifications Start End  Willow Ora, MD PCP - General Family Medicine  08/13/17   Lenn Sink, DPM Consulting Physician Podiatry  08/13/17   Sharrell Ku, MD Consulting Physician Gastroenterology  08/13/17   Gerald Leitz, MD Consulting Physician Obstetrics and Gynecology  08/13/17   Sheppard Evens  Ophthalmology  08/13/17     Objective  Vitals: BP 130/80   Pulse 72   Temp 98.5 F (36.9 C)   Ht 5\' 6"  (1.676 m)   Wt 189 lb 6.4 oz (85.9 kg)   SpO2 96%   BMI 30.57 kg/m  General:  Well developed, well nourished, no acute distress  Psych:  Alert and  orientedx3,normal mood and affect HEENT:  Normocephalic, atraumatic, non-icteric sclera,  supple neck without adenopathy, mass or thyromegaly Cardiovascular:  Normal S1, S2, RRR without gallop, rub or murmur Respiratory:  Good breath sounds bilaterally, CTAB with normal respiratory effort Gastrointestinal: normal bowel sounds, soft, non-tender, no noted masses. No HSM MSK: no deformities, contusions. Joints are without erythema or swelling.  Skin:  Warm, no rashes or suspicious lesions noted Neurologic:    Mental status is normal. Gross motor and sensory exams are normal. Normal gait. No tremor .   Commons side effects, risks, benefits, and alternatives for medications and treatment plan prescribed today were  discussed, and the patient expressed understanding of the given instructions. Patient is instructed to call or message via MyChart if he/she has any questions or concerns regarding our treatment plan. No barriers to understanding were identified. We discussed Red Flag symptoms and signs in detail. Patient expressed understanding regarding what to do in case of urgent or emergency type symptoms.  Medication list was reconciled, printed and provided to the patient in AVS. Patient instructions and summary information was reviewed with the patient as documented in the AVS. This note was prepared with assistance of Dragon voice recognition software. Occasional wrong-word or sound-a-like substitutions may have occurred due to the inherent limitations of voice recognition software

## 2022-02-07 NOTE — Patient Instructions (Signed)
Please return in 12 months for your annual complete physical; please come fasting.   I will release your lab results to you on your MyChart account with further instructions. You may see the results before I do, but when I review them I will send you a message with my report or have my assistant call you if things need to be discussed. Please reply to my message with any questions. Thank you!   If you have any questions or concerns, please don't hesitate to send me a message via MyChart or call the office at [phone]. Thank you for visiting with us today! It's our pleasure caring for you.   Calcium Intake Recommendations You can take Caltrate Plus twice a day or get it through your diet or other OTC supplements (Viactiv, OsCal etc)  Calcium is a mineral that affects many functions in the body, including: Blood clotting. Blood vessel function. Nerve impulse conduction. Hormone secretion. Muscle contraction. Bone and teeth functions.  Most of your body's calcium supply is stored in your bones and teeth. When your calcium stores are low, you may be at risk for low bone mass, bone loss, and bone fractures. Consuming enough calcium helps to grow healthy bones and teeth and to prevent breakdown over time. It is very important that you get enough calcium if you are: A child undergoing rapid growth. An adolescent girl. A pre- or post-menopausal woman. A woman whose menstrual cycle has stopped due to anorexia nervosa or regular intense exercise. An individual with lactose intolerance or a milk allergy. A vegetarian.  What is my plan? Try to consume the recommended amount of calcium daily based on your age. Depending on your overall health, your health care provider may recommend increased calcium intake. General daily calcium intake recommendations by age are: Birth to 6 months: 200 mg. Infants 7 to 12 months: 260 mg. Children 1 to 3 years: 700 mg. Children 4 to 8 years: 1,000 mg. Children  9 to 13 years: 1,300 mg. Teens 14 to 18 years: 1,300 mg. Adults 19 to 50 years: 1,000 mg. Adult women 51 to 70 years: 1,200 mg. Adult men 51 to 70 years: 1,000 mg. Adults 71 years and older: 1,200 mg. Pregnant and breastfeeding teens: 1,300 mg. Pregnant and breastfeeding adults: 1,000 mg.  What do I need to know about calcium intake? In order for the body to absorb calcium, it needs vitamin D. You can get vitamin D through (we recommend getting 400-1000 units of Vitamin D daily) Direct exposure of the skin to sunlight. Foods, such as egg yolks, liver, saltwater fish, and fortified milk. Supplements. Consuming too much calcium may cause: Constipation. Decreased absorption of iron and zinc. Kidney stones. Calcium supplements may interact with certain medicines. Check with your health care provider before starting any calcium supplements. Try to get most of your calcium from food. What foods can I eat? Grains  Fortified oatmeal. Fortified ready-to-eat cereals. Fortified frozen waffles. Vegetables Turnip greens. Broccoli. Fruits Fortified orange juice. Meats and Other Protein Sources Canned sardines with bones. Canned salmon with bones. Soy beans. Tofu. Baked beans. Almonds. Brazil nuts. Sunflower seeds. Dairy Milk. Yogurt. Cheese. Cottage cheese. Beverages Fortified soy milk. Fortified rice milk. Sweets/Desserts Pudding. Ice Cream. Milkshakes. Blackstrap molasses. The items listed above may not be a complete list of recommended foods or beverages. Contact your dietitian for more options. What foods can affect my calcium intake? It may be more difficult for your body to use calcium or calcium may leave your   body more quickly if you consume large amounts of: Sodium. Protein. Caffeine. Alcohol.  This information is not intended to replace advice given to you by your health care provider. Make sure you discuss any questions you have with your health care provider. Document  Released: 12/27/2003 Document Revised: 12/02/2015 Document Reviewed: 10/20/2013 Elsevier Interactive Patient Education  2018 [employer].   Osteopenia  Osteopenia is a loss of thickness (density) inside the bones. Another name for osteopenia is low bone mass. Mild osteopenia is a normal part of aging. It is not a disease, and it does not cause symptoms. However, if you have osteopenia and continue to lose bone mass, you could develop a condition that causes the bones to become thin and break more easily (osteoporosis). Osteoporosis can cause you to lose some height, have back pain, and have a stooped posture. Although osteopenia is not a disease, making changes to your lifestyle and diet can help to prevent osteopenia from developing into osteoporosis. What are the causes? Osteopenia is caused by loss of calcium in the bones. Bones are constantly changing. Old bone cells are continually being replaced with new bone cells. This process builds new bone. The mineral calcium is needed to build new bone and maintain bone density. Bone density is usually highest around age 61. After that, most people's bodies cannot replace all the bone they have lost with new bone. What increases the risk? You are more likely to develop this condition if: You are older than age 7. You are a woman who went through menopause early. You have a long illness that keeps you in bed. You do not get enough exercise. You lack certain nutrients (malnutrition). You have an overactive thyroid gland (hyperthyroidism). You use products that contain nicotine or tobacco, such as cigarettes, e-cigarettes and chewing tobacco, or you drink a lot of alcohol. You are taking medicines that weaken the bones, such as steroids. What are the signs or symptoms? This condition does not cause any symptoms. You may have a slightly higher risk for bone breaks (fractures), so getting fractures more easily than normal may be an indication of  osteopenia. How is this diagnosed? This condition may be diagnosed based on an X-ray exam that measures bone density (dual-energy X-ray absorptiometry, or DEXA). This test can measure bone density in your hips, spine, and wrists. Osteopenia has no symptoms, so this condition is usually diagnosed after a routine bone density screening test is done for osteoporosis. This routine screening is usually done for: Women who are age 54 or older. Men who are age 19 or older. If you have risk factors for osteopenia, you may have the screening test at an earlier age. How is this treated? Making dietary and lifestyle changes can lower your risk for osteoporosis. If you have severe osteopenia that is close to becoming osteoporosis, this condition can be treated with medicines and dietary supplements such as calcium and vitamin D. These supplements help to rebuild bone density. Follow these instructions at home: Eating and drinking Eat a diet that is high in calcium and vitamin D. Calcium is found in dairy products, beans, salmon, and leafy green vegetables like spinach and broccoli. Look for foods that have vitamin D and calcium added to them (fortified foods), such as orange juice, cereal, and bread.  Lifestyle Do 30 minutes or more of a weight-bearing exercise every day, such as walking, jogging, or playing a sport. These types of exercises strengthen the bones. Do not use any products that contain  nicotine or tobacco, such as cigarettes, e-cigarettes, and chewing tobacco. If you need help quitting, ask your health care provider. Do not drink alcohol if: Your health care provider tells you not to drink. You are pregnant, may be pregnant, or are planning to become pregnant. If you drink alcohol: Limit how much you use to: 0-1 drink a day for women. 0-2 drinks a day for men. Be aware of how much alcohol is in your drink. In the [place] one drink equals one 12 oz bottle of beer (355 mL), one 5 oz glass of  wine (148 mL), or one 1 oz glass of hard liquor (44 mL). General instructions Take over-the-counter and prescription medicines only as told by your health care provider. These include vitamins and supplements. Take precautions at home to lower your risk of falling, such as: Keeping rooms well-lit and free of clutter, such as cords. Installing safety rails on stairs. Using rubber mats in the bathroom or other areas that are often wet or slippery. Keep all follow-up visits. This is important. Contact a health care provider if: You have not had a bone density screening for osteoporosis and you are: A woman who is age 67 or older. A man who is age 13 or older. You are a postmenopausal woman who has not had a bone density screening for osteoporosis. You are older than age 38 and you want to know if you should have bone density screening for osteoporosis. Summary Osteopenia is a loss of thickness (density) inside the bones. Another name for osteopenia is low bone mass. Osteopenia is not a disease, but it may increase your risk for a condition that causes the bones to become thin and break more easily (osteoporosis). You may be at risk for osteopenia if you are older than age 74 or if you are a woman who went through early menopause. Osteopenia does not cause any symptoms, but it can be diagnosed with a bone density screening test. Dietary and lifestyle changes are the first treatment for osteopenia. These may lower your risk for osteoporosis. This information is not intended to replace advice given to you by your health care provider. Make sure you discuss any questions you have with your health care provider. Document Revised: 10/29/2019 Document Reviewed: 10/29/2019 Elsevier Patient Education  2023 [employer].

## 2022-02-15 ENCOUNTER — Encounter: Payer: BC Managed Care – PPO | Admitting: Family Medicine

## 2022-04-06 ENCOUNTER — Other Ambulatory Visit: Payer: Self-pay | Admitting: Family Medicine

## 2022-06-15 ENCOUNTER — Other Ambulatory Visit: Payer: Self-pay | Admitting: Family Medicine

## 2022-06-15 DIAGNOSIS — Z1231 Encounter for screening mammogram for malignant neoplasm of breast: Secondary | ICD-10-CM

## 2022-08-02 ENCOUNTER — Ambulatory Visit
Admission: RE | Admit: 2022-08-02 | Discharge: 2022-08-02 | Disposition: A | Discharge status: Home / Self Care | Payer: BC Managed Care – PPO | Source: Ambulatory Visit

## 2022-08-02 DIAGNOSIS — Z1231 Encounter for screening mammogram for malignant neoplasm of breast: Secondary | ICD-10-CM

## 2022-10-22 ENCOUNTER — Other Ambulatory Visit: Payer: Self-pay | Admitting: Family Medicine

## 2022-10-23 ENCOUNTER — Telehealth: Payer: Self-pay | Admitting: Family Medicine

## 2022-10-23 ENCOUNTER — Other Ambulatory Visit: Payer: Self-pay | Admitting: Family Medicine

## 2022-10-23 NOTE — Telephone Encounter (Signed)
Pt has 2 new grandchildren coming soon. She is asking if she should get RSV vaccine. And it was also recommended for her to get her Tdap if she has not had one in the last 2 years. She is not due until 11/25. Please advise.

## 2022-10-24 NOTE — Telephone Encounter (Signed)
Message sent thru MyChart to inform pt of Andy's recommendations on vaccines:  I recommend both the RSV vaccine and updating her Tdap.

## 2022-11-20 ENCOUNTER — Other Ambulatory Visit: Payer: Self-pay | Admitting: Family Medicine

## 2022-12-25 ENCOUNTER — Other Ambulatory Visit: Payer: Self-pay | Admitting: Family Medicine

## 2023-01-24 ENCOUNTER — Other Ambulatory Visit: Payer: Self-pay | Admitting: Family Medicine

## 2023-01-25 ENCOUNTER — Other Ambulatory Visit: Payer: Self-pay | Admitting: Family Medicine

## 2023-02-05 ENCOUNTER — Other Ambulatory Visit: Payer: Self-pay | Admitting: Family Medicine

## 2023-03-14 ENCOUNTER — Ambulatory Visit: Payer: BC Managed Care – PPO | Admitting: Family Medicine

## 2023-03-14 ENCOUNTER — Encounter: Payer: Self-pay | Admitting: Family Medicine

## 2023-03-14 VITALS — BP 124/72 | HR 70 | Temp 98.1°F | Ht 66.0 in | Wt 190.4 lb

## 2023-03-14 DIAGNOSIS — Z23 Encounter for immunization: Secondary | ICD-10-CM | POA: Diagnosis not present

## 2023-03-14 DIAGNOSIS — L239 Allergic contact dermatitis, unspecified cause: Secondary | ICD-10-CM | POA: Insufficient documentation

## 2023-03-14 DIAGNOSIS — R7301 Impaired fasting glucose: Secondary | ICD-10-CM | POA: Diagnosis not present

## 2023-03-14 DIAGNOSIS — I10 Essential (primary) hypertension: Secondary | ICD-10-CM | POA: Diagnosis not present

## 2023-03-14 DIAGNOSIS — Z0001 Encounter for general adult medical examination with abnormal findings: Secondary | ICD-10-CM

## 2023-03-14 DIAGNOSIS — M8580 Other specified disorders of bone density and structure, unspecified site: Secondary | ICD-10-CM | POA: Diagnosis not present

## 2023-03-14 DIAGNOSIS — Z780 Asymptomatic menopausal state: Secondary | ICD-10-CM

## 2023-03-14 DIAGNOSIS — E782 Mixed hyperlipidemia: Secondary | ICD-10-CM

## 2023-03-14 LAB — COMPREHENSIVE METABOLIC PANEL
ALT: 29 U/L (ref 0–35)
AST: 28 U/L (ref 0–37)
Albumin: 4.4 g/dL (ref 3.5–5.2)
Alkaline Phosphatase: 103 U/L (ref 39–117)
BUN: 12 mg/dL (ref 6–23)
CO2: 27 meq/L (ref 19–32)
Calcium: 9.8 mg/dL (ref 8.4–10.5)
Chloride: 101 meq/L (ref 96–112)
Creatinine, Ser: 0.83 mg/dL (ref 0.40–1.20)
GFR: 72.87 mL/min (ref >60.00)
Glucose, Bld: 109 mg/dL — ABNORMAL HIGH (ref 70–99)
Potassium: 4 meq/L (ref 3.5–5.1)
Sodium: 138 meq/L (ref 135–145)
Total Bilirubin: 0.6 mg/dL (ref 0.2–1.2)
Total Protein: 7.2 g/dL (ref 6.0–8.3)

## 2023-03-14 LAB — CBC WITH DIFFERENTIAL/PLATELET
Basophils Absolute: 0 10*3/uL (ref 0.0–0.1)
Basophils Relative: 0.9 % (ref 0.0–3.0)
Eosinophils Absolute: 0.1 10*3/uL (ref 0.0–0.7)
Eosinophils Relative: 2.7 % (ref 0.0–5.0)
HCT: 41.9 % (ref 36.0–46.0)
Hemoglobin: 13.8 g/dL (ref 12.0–15.0)
Lymphocytes Relative: 25 % (ref 12.0–46.0)
Lymphs Abs: 1.2 10*3/uL (ref 0.7–4.0)
MCHC: 33 g/dL (ref 30.0–36.0)
MCV: 90.9 fL (ref 78.0–100.0)
Monocytes Absolute: 0.5 10*3/uL (ref 0.1–1.0)
Monocytes Relative: 9.7 % (ref 3.0–12.0)
Neutro Abs: 3.1 10*3/uL (ref 1.4–7.7)
Neutrophils Relative %: 61.7 % (ref 43.0–77.0)
Platelets: 275 10*3/uL (ref 150.0–400.0)
RBC: 4.61 Mil/uL (ref 3.87–5.11)
RDW: 14.4 % (ref 11.5–15.5)
WBC: 5 10*3/uL (ref 4.0–10.5)

## 2023-03-14 LAB — LIPID PANEL
Cholesterol: 163 mg/dL (ref 0–200)
HDL: 50.7 mg/dL (ref >39.00)
LDL Cholesterol: 88 mg/dL (ref 0–99)
NonHDL: 111.81
Total CHOL/HDL Ratio: 3
Triglycerides: 120 mg/dL (ref 0.0–149.0)
VLDL: 24 mg/dL (ref 0.0–40.0)

## 2023-03-14 LAB — VITAMIN D 25 HYDROXY (VIT D DEFICIENCY, FRACTURES): VITD: 34.51 ng/mL (ref 30.00–100.00)

## 2023-03-14 LAB — TSH: TSH: 1.13 u[IU]/mL (ref 0.35–5.50)

## 2023-03-14 LAB — HEMOGLOBIN A1C: Hgb A1c MFr Bld: 6 % (ref 4.6–6.5)

## 2023-03-14 MED ORDER — TRIAMCINOLONE ACETONIDE 0.1 % EX CREA: TOPICAL_CREAM | CUTANEOUS | 3 refills | Status: AC | PRN

## 2023-03-14 MED ORDER — METOPROLOL SUCCINATE ER 100 MG PO TB24: 100.0000 mg | ORAL_TABLET | Freq: Every day | ORAL | 0 refills | Status: DC

## 2023-03-14 MED ORDER — SIMVASTATIN 40 MG PO TABS: ORAL_TABLET | ORAL | 0 refills | Status: DC

## 2023-03-14 MED ORDER — TELMISARTAN-HCTZ 40-12.5 MG PO TABS: 1.0000 | ORAL_TABLET | Freq: Every day | ORAL | 3 refills | Status: DC

## 2023-03-14 NOTE — Progress Notes (Addendum)
Subjective  Chief Complaint  Patient presents with   Annual Exam    Pt here for Annual Exam and is currently fasting    Hypertension    HPI: Melissa Bryant is a 67 y.o. female who presents to [facility] [place] today for a Female Wellness Visit. She also has the concerns and/or needs as listed above in the chief complaint. These will be addressed in addition to the Health Maintenance Visit.   Wellness Visit: annual visit with health maintenance review and exam  HM: doing very well. Just returned from a beach trip. HM is current. Had RSV and tdap vaccines at [facility] this July. Due flu shot today. Mammo and colonoscopy are current. Has 2 granddaughters now: [place].  Hasn't seen gyn this year: doesn't really have gyn needs any longer.   Chronic disease f/u and/or acute problem visit: (deemed necessary to be done in addition to the wellness visit): HLD on statin and stable. Diet is fair. No adverse effects IFG w/o sxs of hyperglycemia HTN: hasn't checked all year. Had been very well controlled. Taking meds. No cp or sob. Weight is fairly stable. High sodium diet recently due to vacation.  Osteopenia: dexa up to date.  Requests triamcinolone refill: uses for allergic reactions from mosquito bites.   Assessment  1. Encounter for well adult exam with abnormal findings   2. Need for influenza vaccination   3. Mixed hyperlipidemia   4. Impaired fasting glucose   5. Osteopenia after menopause   6. Uncontrolled hypertension   7. Allergic dermatitis      Plan  Female Wellness Visit: Age appropriate Health Maintenance and Prevention measures were discussed with patient. Included topics are cancer screening recommendations, ways to keep healthy (see AVS) including dietary and exercise recommendations, regular eye and dental care, use of seat belts, and avoidance of moderate alcohol use and tobacco use. Current screen BMI: discussed patient's BMI and  encouraged positive lifestyle modifications to help get to or maintain a target BMI. HM needs and immunizations were addressed and ordered. See below for orders. See HM and immunization section for updates. Flu shot today Routine labs and screening tests ordered including cmp, cbc and lipids where appropriate. Discussed recommendations regarding Vit D and calcium supplementation (see AVS)  Chronic disease management visit and/or acute problem visit: HTN: pt to restart low sodium diet, increase water intake, and start home bp monitoring. She will send me readings. If remains elevated, I will add amlodipine. Other meds to continue: metoprolol xl 100 and micardis hct 40/12.5 daily. Check renal function and electrolytes. Check thyroid function as well: addendment: 10/29: home readings are normal. See mychart message. No change in therapy.  HLD: recheck fasting lipids and lfts on crestor Ifg for A1c check today. No sxs of diabetes  Follow up: 3 mo for bp recheck; or 12 mo for cpe depending on home bp readings.    Orders Placed This Encounter  Procedures   Flu Vaccine Trivalent High Dose (Fluad)   VITAMIN D 25 Hydroxy (Vit-D Deficiency, Fractures)   CBC with Differential/Platelet   Comprehensive metabolic panel   Lipid panel   TSH   Hemoglobin A1c   Meds ordered this encounter  Medications   triamcinolone cream (KENALOG) 0.1 %    Sig: Apply topically as needed.    Dispense:  45 g    Refill:  3   metoprolol succinate (TOPROL-XL) 100 MG 24 hr tablet    Sig: Take 1  tablet (100 mg total) by mouth daily. Take with or immediately following a meal.    Dispense:  30 tablet    Refill:  0    No more refills until OV   telmisartan-hydrochlorothiazide (MICARDIS HCT) 40-12.5 MG tablet    Sig: Take 1 tablet by mouth daily.    Dispense:  90 tablet    Refill:  3   simvastatin (ZOCOR) 40 MG tablet    Sig: TAKE 1 TABLET(40 MG) BY MOUTH AT BEDTIME    Dispense:  30 tablet    Refill:  0      Body  mass index is 30.73 kg/m. Wt Readings from Last 3 Encounters:  03/14/23 190 lb 6.4 oz (86.4 kg)  02/07/22 189 lb 6.4 oz (85.9 kg)  08/02/21 187 lb 6.4 oz (85 kg)     Patient Active Problem List   Diagnosis Date Noted Date Diagnosed   Impaired fasting glucose 11/19/2018     Priority: High   Mixed hyperlipidemia 02/11/2009     Priority: High   Essential hypertension 02/11/2009     Priority: High   Osteopenia after menopause 02/07/2022     Priority: Medium     DEXA 01/2022 lowest T forearm: -2.2, lumbar -1.1: osteopenia; recheck 2 years.     Primary osteoarthritis of right shoulder 02/06/2021     Priority: Medium     Severe glenohumeral arthritis, right. Dr. Logan Bores. Will need replacement at some point. 2022    Primary osteoarthritis of both knees 01/01/2019     Priority: Medium     Right knee xray: severe OA changes    Osteoarthritis, multiple sites 02/11/2009     Priority: Medium    Allergic dermatitis 03/14/2023     Priority: Low    Moderate reactions to mosquito bites: uses TAC cream prn    Adult acne 11/19/2018     Priority: Low   History of colon polyps - benign, q 47yr 02/11/2009     Priority: Low   Health Maintenance  Topic Date Due   COVID-19 Vaccine (4 - 2023-24 season) 03/30/2023 (Originally 01/27/2023)   MAMMOGRAM  08/02/2023   DEXA SCAN  02/01/2024   Colonoscopy  08/07/2026   DTaP/Tdap/Td (4 - Td or Tdap) 11/25/2032   Pneumonia Vaccine 10+ Years old  Completed   INFLUENZA VACCINE  Completed   Hepatitis C Screening  Completed   Zoster Vaccines- Shingrix  Completed   HPV VACCINES  Aged Out   Immunization History  Administered Date(s) Administered   Fluad Quad(high Dose 65+) 02/02/2021, 02/07/2022   Fluad Trivalent(High Dose 65+) 03/14/2023   Influenza Split 02/26/2012   Influenza Whole 02/01/2010   Influenza,inj,Quad PF,6+ Mos 03/31/2013, 06/08/2015, 02/17/2018, 02/16/2019, 01/28/2020   Influenza-Unspecified 02/26/2016   PFIZER(Purple Top)SARS-COV-2  Vaccination 08/09/2019, 08/31/2019, 05/04/2020   PNEUMOCOCCAL CONJUGATE-20 02/02/2021   RSV,unspecified 11/26/2022   Td 05/29/2003, 03/30/2014   Tdap 11/26/2022   Zoster Recombinant(Shingrix) 01/01/2019, 07/02/2019   We updated and reviewed the patient's past history in detail and it is documented below. Allergies: Patient is allergic to sulfonamide derivatives. Past Medical History Patient  has a past medical history of Heart murmur, colonic polyps, Hyperlipidemia, Hypertension, Osteoarthritis, and Osteopenia after menopause (02/07/2022). Past Surgical History Patient  has a past surgical history that includes Knee arthroscopy and arthrotomy (2007); Colonoscopy (2009 in [place], [place].); and Tubal ligation. Family History: Patient family history includes Alcohol abuse in her father; Arthritis in her brother and brother; Diabetes in her brother and brother; Healthy in her son,  son, and son; Hyperlipidemia in her brother and brother; Lung cancer in her mother; Stroke in her maternal grandfather. Social History:  Patient  reports that she has never smoked. She has never used smokeless tobacco. She reports current alcohol use. She reports that she does not use drugs.  Review of Systems: Constitutional: negative for fever or malaise Ophthalmic: negative for photophobia, double vision or loss of vision Cardiovascular: negative for chest pain, dyspnea on exertion, or new LE swelling Respiratory: negative for SOB or persistent cough Gastrointestinal: negative for abdominal pain, change in bowel habits or melena Genitourinary: negative for dysuria or gross hematuria, no abnormal uterine bleeding or disharge Musculoskeletal: negative for new gait disturbance or muscular weakness Integumentary: negative for new or persistent rashes, no breast lumps Neurological: negative for TIA or stroke symptoms Psychiatric: negative for SI or delusions Allergic/Immunologic: negative for hives  Patient Care  Team    Relationship Specialty Notifications Start End  Willow Ora, MD PCP - General Family Medicine  08/13/17   Lenn Sink, DPM Consulting Physician Podiatry  08/13/17   Sharrell Ku, MD Consulting Physician Gastroenterology  08/13/17   Gerald Leitz, MD Consulting Physician Obstetrics and Gynecology  08/13/17   Sheppard Evens  Ophthalmology  08/13/17     Objective  Vitals: BP (!) 158/82   Pulse 70   Temp 98.1 F (36.7 C)   Ht 5\' 6"  (1.676 m)   Wt 190 lb 6.4 oz (86.4 kg)   SpO2 98%   BMI 30.73 kg/m  General:  Well developed, well nourished, no acute distress  Psych:  Alert and orientedx3,normal mood and affect HEENT:  Normocephalic, atraumatic, non-icteric sclera,  supple neck without adenopathy, mass or thyromegaly Cardiovascular:  Normal S1, S2, RRR without gallop, rub or murmur Respiratory:  Good breath sounds bilaterally, CTAB with normal respiratory effort Gastrointestinal: normal bowel sounds, soft, non-tender, no noted masses. No HSM MSK: extremities without edema, joints without erythema or swelling Neurologic:    Mental status is normal.  Gross motor and sensory exams are normal.  No tremor  Commons side effects, risks, benefits, and alternatives for medications and treatment plan prescribed today were discussed, and the patient expressed understanding of the given instructions. Patient is instructed to call or message via MyChart if he/she has any questions or concerns regarding our treatment plan. No barriers to understanding were identified. We discussed Red Flag symptoms and signs in detail. Patient expressed understanding regarding what to do in case of urgent or emergency type symptoms.  Medication list was reconciled, printed and provided to the patient in AVS. Patient instructions and summary information was reviewed with the patient as documented in the AVS. This note was prepared with assistance of Dragon voice recognition software. Occasional wrong-word or  sound-a-like substitutions may have occurred due to the inherent limitations of voice recognition software

## 2023-03-15 ENCOUNTER — Telehealth: Payer: Self-pay | Admitting: Family Medicine

## 2023-03-15 NOTE — Telephone Encounter (Signed)
Pt states only received 30 day supply of  simvastatin (ZOCOR) 40 MG tablet   metoprolol succinate (TOPROL-XL) 100 MG 24 hr tablet   States she should be getting 90 day refills with at least 3 more refills.   Please advise

## 2023-03-16 NOTE — Progress Notes (Signed)
See mychart note Dear Ms. Dockendorf, It was good seeing you. Your lab results look good overall. Your cholesterol levels are at goal; your kidney, liver, salts, and thyroid are all normal. Your sugars remain in the mildly elevated or early prediabetic range. Keeping to a low sugar diet remains important.  No medicine changes are needed at this time.  Sincerely, Dr. Mardelle Matte

## 2023-03-18 ENCOUNTER — Other Ambulatory Visit: Payer: Self-pay

## 2023-03-18 MED ORDER — SIMVASTATIN 40 MG PO TABS: ORAL_TABLET | ORAL | 3 refills | Status: DC

## 2023-03-18 MED ORDER — METOPROLOL SUCCINATE ER 100 MG PO TB24: 100.0000 mg | ORAL_TABLET | Freq: Every day | ORAL | 3 refills | Status: DC

## 2023-03-18 NOTE — Telephone Encounter (Signed)
Pt states the 2 RX below should have had 3 refills and needs to be changed because her insurance will not pay for no refills. Please advise.

## 2023-03-22 ENCOUNTER — Encounter: Payer: Self-pay | Admitting: Family Medicine

## 2023-06-19 ENCOUNTER — Ambulatory Visit: Payer: BC Managed Care – PPO | Admitting: Family Medicine

## 2023-07-06 IMAGING — DX DG SHOULDER 2+V*R*
3 series · 3 of 3 positions shown · non-contrast
Comparison: None.

CLINICAL DATA: Right-sided shoulder pain

EXAM:
RIGHT SHOULDER - 2+ VIEW

[shoulder ap (1 of 2)]
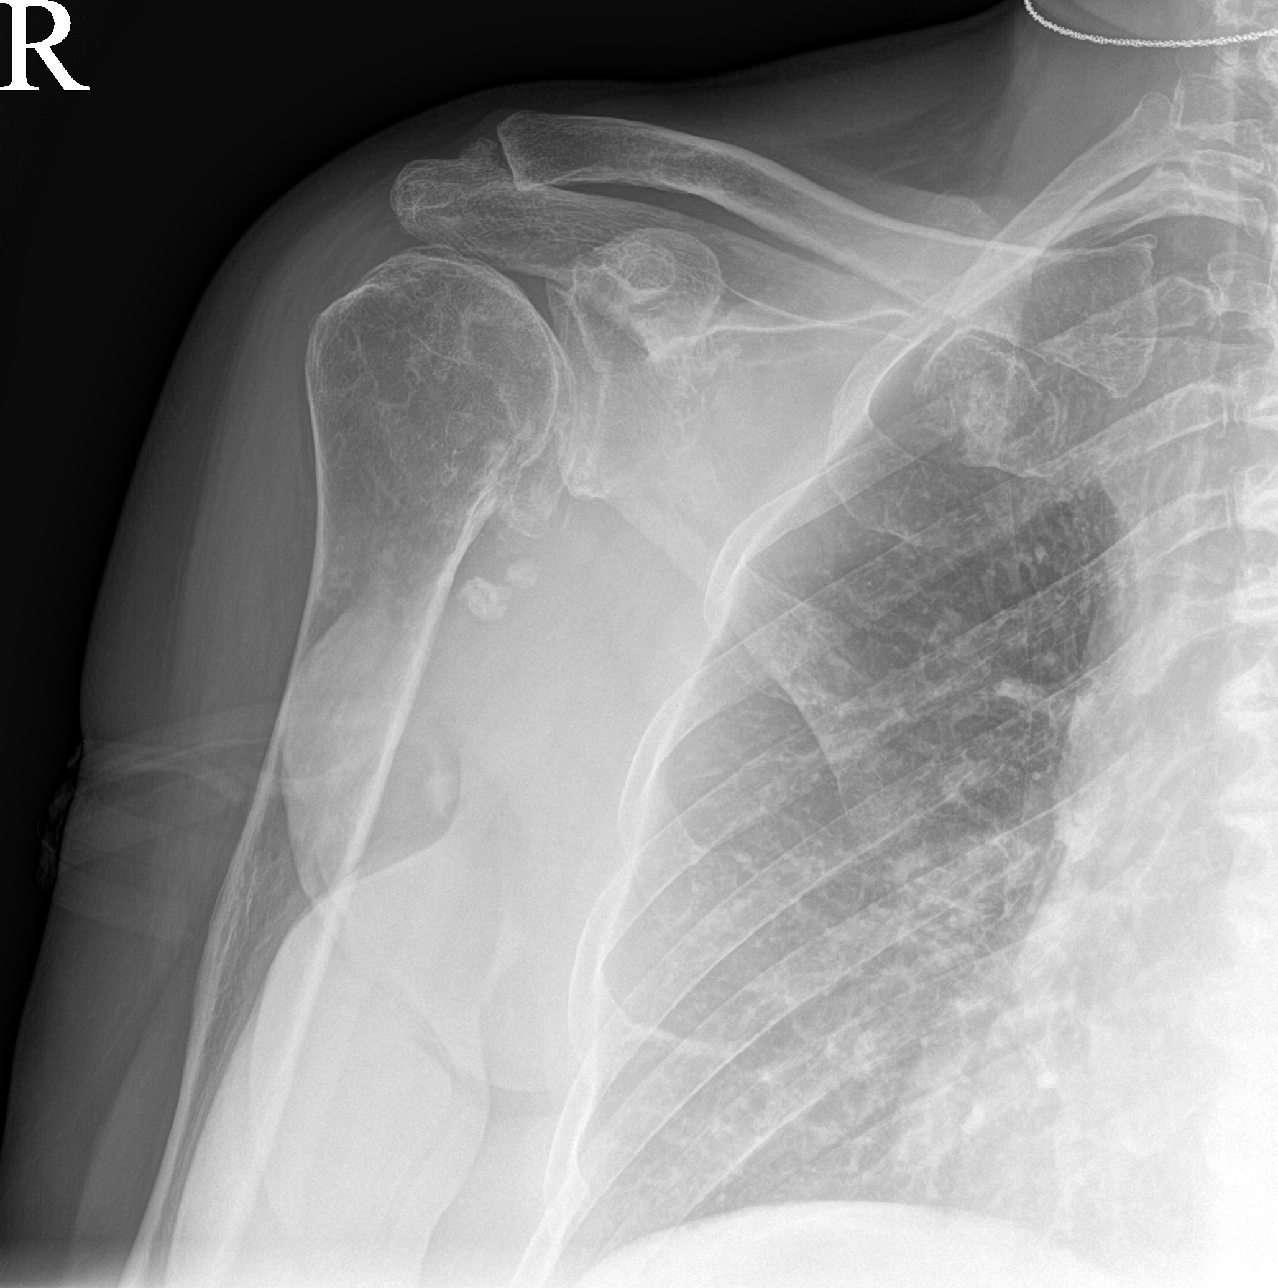

[shoulder ap (2 of 2)]
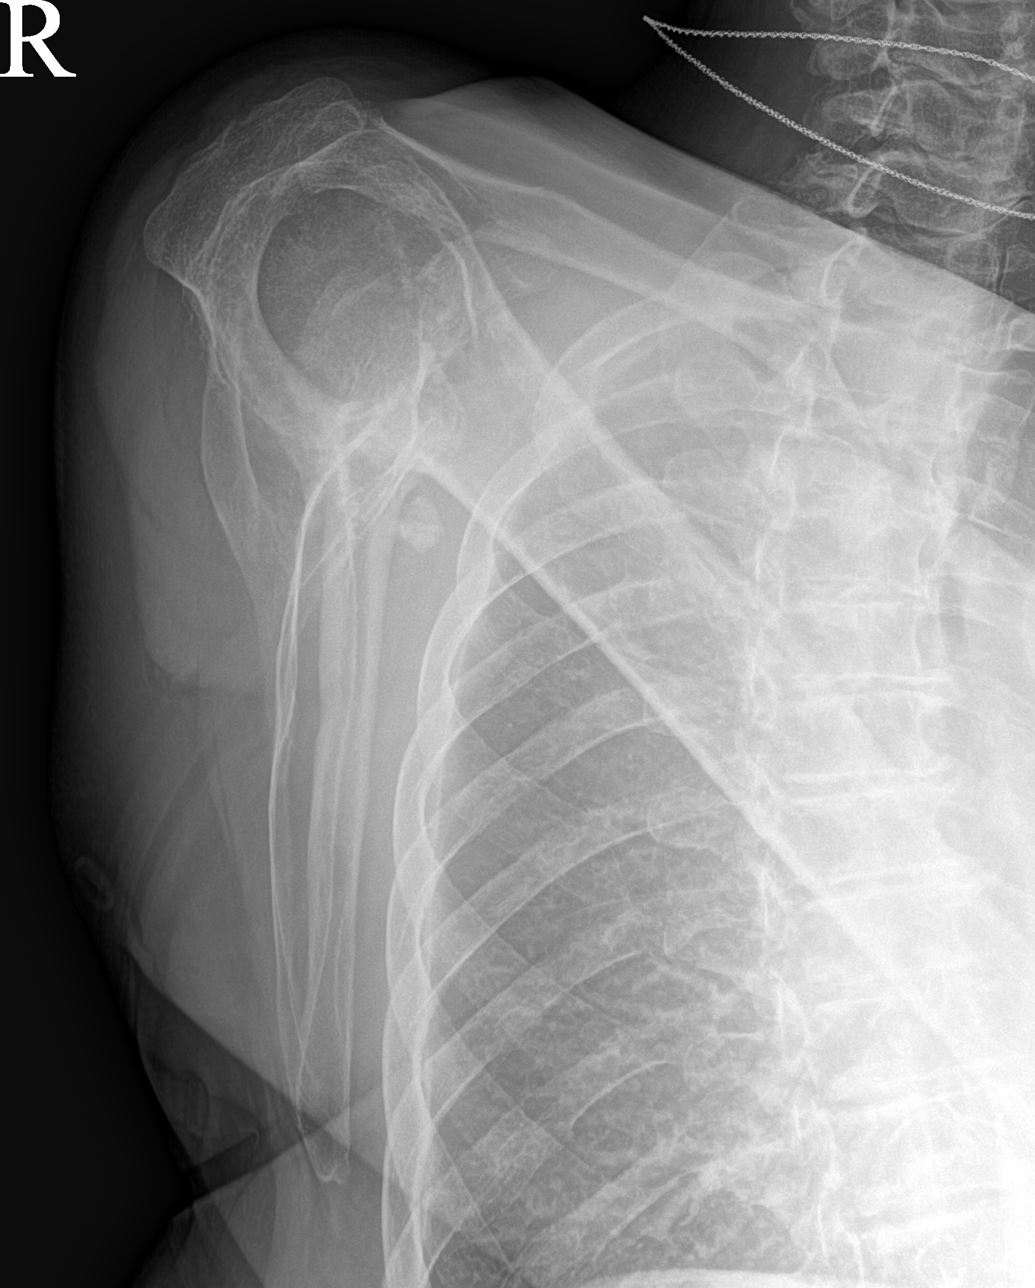

[shoulder axial]
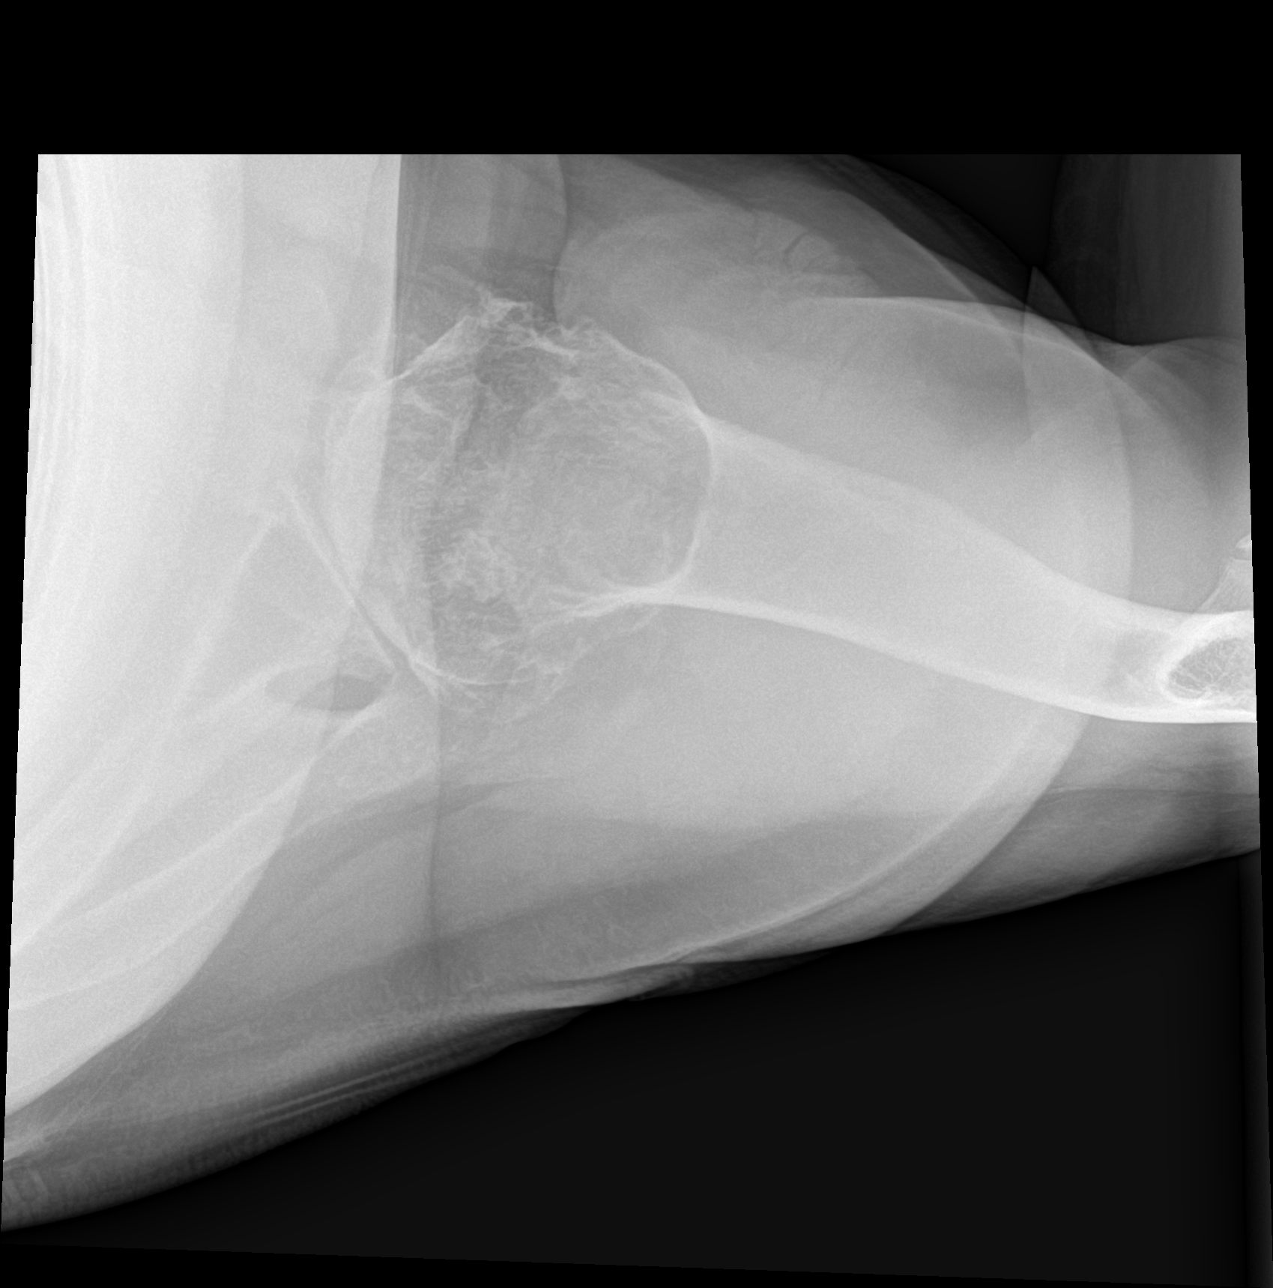

[3 of 3 positions shown; findings below may reference images not displayed]

FINDINGS: No fracture or malalignment. Advanced glenohumeral degenerative
change with pedunculated osteophyte inferiorly. Multiple calcified
loose bodies at the inferior shoulder. Mild AC joint degenerative
change
IMPRESSION: Advanced glenohumeral degenerative changes with multiple calcified
loose bodies

## 2023-07-08 ENCOUNTER — Other Ambulatory Visit: Payer: Self-pay | Admitting: Family Medicine

## 2023-07-08 DIAGNOSIS — Z1231 Encounter for screening mammogram for malignant neoplasm of breast: Secondary | ICD-10-CM

## 2023-07-25 DIAGNOSIS — C44519 Basal cell carcinoma of skin of other part of trunk: Secondary | ICD-10-CM | POA: Diagnosis not present

## 2023-07-25 DIAGNOSIS — L820 Inflamed seborrheic keratosis: Secondary | ICD-10-CM | POA: Diagnosis not present

## 2023-08-05 ENCOUNTER — Ambulatory Visit
Admission: RE | Admit: 2023-08-05 | Discharge: 2023-08-05 | Disposition: A | Discharge status: Home / Self Care | Payer: BC Managed Care – PPO | Source: Ambulatory Visit | Attending: Family Medicine | Admitting: Family Medicine

## 2023-08-05 DIAGNOSIS — Z1231 Encounter for screening mammogram for malignant neoplasm of breast: Secondary | ICD-10-CM | POA: Diagnosis not present

## 2023-08-09 ENCOUNTER — Other Ambulatory Visit: Payer: Self-pay | Admitting: Family Medicine

## 2023-08-09 DIAGNOSIS — R928 Other abnormal and inconclusive findings on diagnostic imaging of breast: Secondary | ICD-10-CM

## 2023-08-26 ENCOUNTER — Ambulatory Visit
Admission: RE | Admit: 2023-08-26 | Discharge: 2023-08-26 | Disposition: A | Discharge status: Home / Self Care | Source: Ambulatory Visit | Attending: Family Medicine | Admitting: Family Medicine

## 2023-08-26 ENCOUNTER — Ambulatory Visit

## 2023-08-26 DIAGNOSIS — R928 Other abnormal and inconclusive findings on diagnostic imaging of breast: Secondary | ICD-10-CM

## 2023-08-26 DIAGNOSIS — N6310 Unspecified lump in the right breast, unspecified quadrant: Secondary | ICD-10-CM | POA: Diagnosis not present

## 2023-09-05 DIAGNOSIS — Z08 Encounter for follow-up examination after completed treatment for malignant neoplasm: Secondary | ICD-10-CM | POA: Diagnosis not present

## 2023-09-05 DIAGNOSIS — Z85828 Personal history of other malignant neoplasm of skin: Secondary | ICD-10-CM | POA: Diagnosis not present

## 2023-11-05 ENCOUNTER — Ambulatory Visit: Admitting: Physician Assistant

## 2023-11-05 VITALS — BP 140/88 | HR 76 | Temp 98.6°F | Ht 66.0 in | Wt 191.8 lb

## 2023-11-05 DIAGNOSIS — R051 Acute cough: Secondary | ICD-10-CM | POA: Diagnosis not present

## 2023-11-05 DIAGNOSIS — J0100 Acute maxillary sinusitis, unspecified: Secondary | ICD-10-CM

## 2023-11-05 MED ORDER — CEFPROZIL 500 MG PO TABS: 500.0000 mg | ORAL_TABLET | Freq: Two times a day (BID) | ORAL | 0 refills | Status: AC

## 2023-11-05 NOTE — Progress Notes (Signed)
 "   Patient ID: Melissa Bryant, female    DOB: 27-Mar-1956, 68 y.o.   MRN: [id number]   Assessment & Plan:  Acute maxillary sinusitis, recurrence not specified  Acute cough  Other orders -     Cefprozil ; Take 1 tablet (500 mg total) by mouth 2 (two) times daily for 10 days.  Dispense: 20 tablet; Refill: 0    Assessment & Plan Upper Respiratory Tract Infection Symptoms consistent with an upper respiratory tract infection, including persistent cough, post-nasal drip, and sinus pressure, began after travel and have persisted for over a week. The cough transitioned from gray and chunky to yellow and clear, suggesting possible bacterial involvement. Reports improvement but symptoms linger. COVID test negative. Suspected bacterial component; considers antibiotic treatment to expedite recovery. Prefers cefprozil  for its efficacy against multiple bacteria and minimal side effects, such as diarrhea. - Prescribe cefprozil  for 10 days, instruct to stop after 7 days if symptoms improve. - Encourage continued hydration.  Hypertension Blood pressure is elevated, likely due to recent activity, stress, and use of over-the-counter medications for respiratory symptoms. Hypertension is present. Advised to monitor blood pressure at home, especially after taking decongestants. - Recheck blood pressure before leaving the office. - Advise monitoring blood pressure at home over the next couple of weeks.    No follow-ups on file.    Subjective:    Chief Complaint  Patient presents with   Acute Visit    Pt in today for cough, congestion, sinus pressure; x1 week, traveled to [facility] 5/21-5/31, negative @ home covid test, mucinex x10 days;     HPI Discussed the use of AI scribe software for clinical note transcription with the patient, who gave verbal consent to proceed.  History of Present Illness Melissa Bryant is a 68 year old female who presents with a persistent cough and sinus pressure following  recent travel.  She developed symptoms after returning from a trip to [place], which included a cruise from the 21st to the 31st of the previous month. She began experiencing a fever of 100F on Sunday morning after returning home on Saturday night, which prevented her from attending mass. She felt extremely fatigued and spent most of Monday and Tuesday in bed.  She performed a COVID test on Monday, which was negative. Her primary symptom is a persistent cough, described by her husband as unusual for her. She feels the cough is not originating from her chest but rather from postnasal drip, which she describes as initially 'gray and chunky' and now 'yellow and clear'. She believes she is at the tail end of the illness but sought medical attention because of her persistent symptoms.  She has been self-medicating with NyQuil, DayQuil, and Mucinex, and using Vicks. She notes that her blood pressure was elevated today, which she attributes to the stress of the appointment and the medications she has been taking. She also reports sinus pressure, which has been improving since last Wednesday or Thursday, but she can feel it return when the effects of DayQuil wear off.  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 She last used Cefzil  in 2016. She does not find Zithromax effective for her symptoms.  She does not currently require Tessalon Perles for her cough, as she feels her symptoms have improved since last week.     Past Medical History:  Diagnosis Date   Heart murmur  Hx of colonic polyps    Hyperlipidemia    Hypertension    Osteoarthritis    Osteopenia after menopause 02/07/2022   DEXA 01/2022 lowest T forearm: -2.2, lumbar -1.1: osteopenia; recheck 2 years.     Past Surgical History:  Procedure Laterality Date   COLONOSCOPY  2009 in [place], [place].   clear, repeat  in 10 yrs    KNEE ARTHROSCOPY AND ARTHROTOMY  2007   left knee   TUBAL LIGATION      Family History  Problem Relation Age of Onset   Lung cancer Mother    Alcohol abuse Father    Diabetes Brother    Arthritis Brother    Hyperlipidemia Brother    Healthy Son    Stroke Maternal Grandfather    Healthy Son    Healthy Son    Diabetes Brother    Arthritis Brother    Hyperlipidemia Brother     Social History   Tobacco Use   Smoking status: Never   Smokeless tobacco: Never  Vaping Use   Vaping status: Never Used  Substance Use Topics   Alcohol use: Yes    Alcohol/week: 0.0 standard drinks of alcohol    Comment: glass of wine or beer each day   Drug use: No     Allergies  Allergen Reactions   Sulfonamide Derivatives Rash    Review of Systems NEGATIVE UNLESS OTHERWISE INDICATED IN HPI      Objective:     BP (!) 140/88 (BP Location: Left Arm, Patient Position: Sitting, Cuff Size: Normal)   Pulse 76   Temp 98.6 F (37 C) (Temporal)   Ht 5' 6 (1.676 m)   Wt 191 lb 12.8 oz (87 kg)   SpO2 98%   BMI 30.96 kg/m   Wt Readings from Last 3 Encounters:  11/05/23 191 lb 12.8 oz (87 kg)  03/14/23 190 lb 6.4 oz (86.4 kg)  02/07/22 189 lb 6.4 oz (85.9 kg)    BP Readings from Last 3 Encounters:  11/05/23 (!) 140/88  03/26/23 124/72  02/07/22 130/80     Physical Exam Vitals and nursing note reviewed.  Constitutional:      General: She is not in acute distress.    Appearance: Normal appearance. She is not ill-appearing.  HENT:     Head: Normocephalic.     Right Ear: Tympanic membrane, ear canal and external ear normal.     Left Ear: Tympanic membrane, ear canal and external ear normal.     Nose: Congestion present.     Right Sinus: Maxillary sinus tenderness present.     Left Sinus: Maxillary sinus tenderness present.     Mouth/Throat:     Mouth: Mucous membranes are moist.     Pharynx: No oropharyngeal exudate or posterior oropharyngeal erythema.  Eyes:      Extraocular Movements: Extraocular movements intact.     Conjunctiva/sclera: Conjunctivae normal.     Pupils: Pupils are equal, round, and reactive to light.  Cardiovascular:     Rate and Rhythm: Normal rate and regular rhythm.     Pulses: Normal pulses.     Heart sounds: Normal heart sounds. No murmur heard. Pulmonary:     Effort: Pulmonary effort is normal. No respiratory distress.     Breath sounds: Normal breath sounds. No wheezing.  Musculoskeletal:     Cervical back: Normal range of motion.  Skin:    General: Skin is warm.  Neurological:     Mental Status: She is alert  and oriented to person, place, and time.  Psychiatric:        Mood and Affect: Mood normal.        Behavior: Behavior normal.             Elayah Klooster M Shamari Lofquist, PA-C "

## 2024-03-03 ENCOUNTER — Emergency Department (HOSPITAL_BASED_OUTPATIENT_CLINIC_OR_DEPARTMENT_OTHER)

## 2024-03-03 ENCOUNTER — Emergency Department (HOSPITAL_BASED_OUTPATIENT_CLINIC_OR_DEPARTMENT_OTHER)
Admission: EM | Admit: 2024-03-03 | Discharge: 2024-03-03 | Disposition: A | Discharge status: Home / Self Care | Attending: Emergency Medicine | Admitting: Emergency Medicine

## 2024-03-03 ENCOUNTER — Emergency Department (HOSPITAL_BASED_OUTPATIENT_CLINIC_OR_DEPARTMENT_OTHER): Admitting: Radiology

## 2024-03-03 ENCOUNTER — Other Ambulatory Visit: Payer: Self-pay

## 2024-03-03 DIAGNOSIS — Y92410 Unspecified street and highway as the place of occurrence of the external cause: Secondary | ICD-10-CM | POA: Diagnosis not present

## 2024-03-03 DIAGNOSIS — R519 Headache, unspecified: Secondary | ICD-10-CM | POA: Diagnosis not present

## 2024-03-03 DIAGNOSIS — E871 Hypo-osmolality and hyponatremia: Secondary | ICD-10-CM | POA: Insufficient documentation

## 2024-03-03 DIAGNOSIS — I10 Essential (primary) hypertension: Secondary | ICD-10-CM | POA: Insufficient documentation

## 2024-03-03 DIAGNOSIS — K5730 Diverticulosis of large intestine without perforation or abscess without bleeding: Secondary | ICD-10-CM | POA: Diagnosis not present

## 2024-03-03 DIAGNOSIS — K862 Cyst of pancreas: Secondary | ICD-10-CM | POA: Diagnosis not present

## 2024-03-03 DIAGNOSIS — I672 Cerebral atherosclerosis: Secondary | ICD-10-CM | POA: Diagnosis not present

## 2024-03-03 DIAGNOSIS — S161XXA Strain of muscle, fascia and tendon at neck level, initial encounter: Secondary | ICD-10-CM | POA: Insufficient documentation

## 2024-03-03 DIAGNOSIS — M79641 Pain in right hand: Secondary | ICD-10-CM | POA: Diagnosis not present

## 2024-03-03 DIAGNOSIS — M19041 Primary osteoarthritis, right hand: Secondary | ICD-10-CM | POA: Diagnosis not present

## 2024-03-03 DIAGNOSIS — S40812A Abrasion of left upper arm, initial encounter: Secondary | ICD-10-CM | POA: Diagnosis not present

## 2024-03-03 DIAGNOSIS — R079 Chest pain, unspecified: Secondary | ICD-10-CM | POA: Diagnosis not present

## 2024-03-03 DIAGNOSIS — M79622 Pain in left upper arm: Secondary | ICD-10-CM | POA: Diagnosis not present

## 2024-03-03 DIAGNOSIS — S60221A Contusion of right hand, initial encounter: Secondary | ICD-10-CM | POA: Insufficient documentation

## 2024-03-03 DIAGNOSIS — M19012 Primary osteoarthritis, left shoulder: Secondary | ICD-10-CM | POA: Diagnosis not present

## 2024-03-03 DIAGNOSIS — M50322 Other cervical disc degeneration at C5-C6 level: Secondary | ICD-10-CM | POA: Diagnosis not present

## 2024-03-03 DIAGNOSIS — M4312 Spondylolisthesis, cervical region: Secondary | ICD-10-CM | POA: Diagnosis not present

## 2024-03-03 DIAGNOSIS — M542 Cervicalgia: Secondary | ICD-10-CM | POA: Diagnosis not present

## 2024-03-03 DIAGNOSIS — M47812 Spondylosis without myelopathy or radiculopathy, cervical region: Secondary | ICD-10-CM | POA: Diagnosis not present

## 2024-03-03 DIAGNOSIS — S199XXA Unspecified injury of neck, initial encounter: Secondary | ICD-10-CM | POA: Diagnosis not present

## 2024-03-03 LAB — COMPREHENSIVE METABOLIC PANEL WITH GFR
ALT: 23 U/L (ref 0–44)
AST: 30 U/L (ref 15–41)
Albumin: 4.6 g/dL (ref 3.5–5.0)
Alkaline Phosphatase: 134 U/L — ABNORMAL HIGH (ref 38–126)
Anion gap: 15 (ref 5–15)
BUN: 12 mg/dL (ref 8–23)
CO2: 22 mmol/L (ref 22–32)
Calcium: 9.9 mg/dL (ref 8.9–10.3)
Chloride: 92 mmol/L — ABNORMAL LOW (ref 98–111)
Creatinine, Ser: 0.83 mg/dL (ref 0.44–1.00)
GFR, Estimated: >60 mL/min (ref >60)
Glucose, Bld: 115 mg/dL — ABNORMAL HIGH (ref 70–99)
Potassium: 3.9 mmol/L (ref 3.5–5.1)
Sodium: 130 mmol/L — ABNORMAL LOW (ref 135–145)
Total Bilirubin: 0.4 mg/dL (ref 0.0–1.2)
Total Protein: 7.4 g/dL (ref 6.5–8.1)

## 2024-03-03 LAB — CBC WITH DIFFERENTIAL/PLATELET
Abs Immature Granulocytes: 0.06 K/uL (ref 0.00–0.07)
Basophils Absolute: 0 K/uL (ref 0.0–0.1)
Basophils Relative: 0 %
Eosinophils Absolute: 0 K/uL (ref 0.0–0.5)
Eosinophils Relative: 0 %
HCT: 39.7 % (ref 36.0–46.0)
Hemoglobin: 13.4 g/dL (ref 12.0–15.0)
Immature Granulocytes: 1 %
Lymphocytes Relative: 8 %
Lymphs Abs: 0.9 K/uL (ref 0.7–4.0)
MCH: 29.6 pg (ref 26.0–34.0)
MCHC: 33.8 g/dL (ref 30.0–36.0)
MCV: 87.8 fL (ref 80.0–100.0)
Monocytes Absolute: 0.6 K/uL (ref 0.1–1.0)
Monocytes Relative: 5 %
Neutro Abs: 9.8 K/uL — ABNORMAL HIGH (ref 1.7–7.7)
Neutrophils Relative %: 86 %
Platelets: 303 K/uL (ref 150–400)
RBC: 4.52 MIL/uL (ref 3.87–5.11)
RDW: 13.6 % (ref 11.5–15.5)
WBC: 11.3 K/uL — ABNORMAL HIGH (ref 4.0–10.5)
nRBC: 0 % (ref 0.0–0.2)

## 2024-03-03 MED ORDER — IOHEXOL 350 MG/ML SOLN
100.0000 mL | Freq: Once | INTRAVENOUS | Status: AC | PRN
  Administered 2024-03-03: 100 mL via INTRAVENOUS

## 2024-03-03 NOTE — Discharge Instructions (Addendum)
 Make an appointment to follow-up with your primary care doctor.  Return to the emergency room if you have any worsening symptoms.  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  Per radiology guidelines, it was recommended that you have a repeat CT scan in 2 years to make sure it looks stable.  Your sodium level was a little low.  Please have your primary care doctor recheck this.

## 2024-03-03 NOTE — ED Notes (Signed)
 X-Ray at bedside.

## 2024-03-03 NOTE — ED Notes (Signed)
 Pt given ice pack

## 2024-03-03 NOTE — ED Notes (Signed)
 Reviewed discharge instructions and follow-up care with pt. Pt verbalized understanding and had no further questions. Pt exited ED without complications.

## 2024-03-03 NOTE — ED Notes (Signed)
 ED Provider at bedside.
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
## 2024-03-03 NOTE — ED Notes (Signed)
 Patient transported to CT
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
## 2024-03-05 ENCOUNTER — Telehealth: Payer: Self-pay

## 2024-03-05 NOTE — Telephone Encounter (Signed)
 Transition Care Management Follow-up Telephone Call Date of discharge and from where: 03/03/24 [facility] How have you been since you were released from the hospital? [facility] Any questions or concerns? No  Items Reviewed: Did the pt receive and understand the discharge instructions provided? Yes  Medications obtained and verified? Yes  Other? No  Any new allergies since your discharge? No  Dietary orders reviewed? No Do you have support at home? Yes   Home Care and Equipment/Supplies: Were home health services ordered? not applicable If so, what is the name of the agency?   Has the agency set up a time to come to the patient's home? not applicable Were any new equipment or medical supplies ordered?  No What is the name of the medical supply agency?  Were you able to get the supplies/equipment? not applicable Do you have any questions related to the use of the equipment or supplies? No  Functional Questionnaire: (I = Independent and D = Dependent) ADLs: I  Bathing/Dressing- I  Meal Prep- I  Eating- I  Maintaining continence- I  Transferring/Ambulation- I  Managing Meds- I  Follow up appointments reviewed:  PCP Hospital f/u appt confirmed? Yes  Scheduled to see Dr Jodie on 03/09/24 @ 9:30am. Specialist Hospital f/u appt confirmed? No   Are transportation arrangements needed? No  If their condition worsens, is the pt aware to call PCP or go to the Emergency Dept.? Yes Was the patient provided with contact information for the PCP's office or ED? Yes Was to pt encouraged to call back with questions or concerns? Yes

## 2024-03-09 ENCOUNTER — Encounter: Payer: Self-pay | Admitting: Family Medicine

## 2024-03-09 ENCOUNTER — Ambulatory Visit: Admitting: Family Medicine

## 2024-03-09 DIAGNOSIS — K862 Cyst of pancreas: Secondary | ICD-10-CM | POA: Diagnosis not present

## 2024-03-09 DIAGNOSIS — I10 Essential (primary) hypertension: Secondary | ICD-10-CM

## 2024-03-09 DIAGNOSIS — Z23 Encounter for immunization: Secondary | ICD-10-CM

## 2024-03-09 MED ORDER — TELMISARTAN-HCTZ 80-12.5 MG PO TABS: 1.0000 | ORAL_TABLET | Freq: Every day | ORAL | 3 refills | Status: AC
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
## 2024-03-09 NOTE — Patient Instructions (Signed)
 Please follow up as scheduled for your next visit with me: 03/18/2024   If you have any questions or concerns, please don't hesitate to send me a message via MyChart or call the office at [phone]. Thank you for visiting with us  today! It's our pleasure caring for you.

## 2024-03-12 DIAGNOSIS — Z08 Encounter for follow-up examination after completed treatment for malignant neoplasm: Secondary | ICD-10-CM | POA: Diagnosis not present

## 2024-03-12 DIAGNOSIS — Z85828 Personal history of other malignant neoplasm of skin: Secondary | ICD-10-CM | POA: Diagnosis not present

## 2024-03-12 DIAGNOSIS — L820 Inflamed seborrheic keratosis: Secondary | ICD-10-CM | POA: Diagnosis not present

## 2024-03-18 ENCOUNTER — Encounter: Payer: Self-pay | Admitting: Family Medicine

## 2024-03-18 ENCOUNTER — Ambulatory Visit: Payer: BC Managed Care – PPO | Admitting: Family Medicine

## 2024-03-18 VITALS — BP 125/77 | HR 67 | Temp 98.4°F | Ht 67.0 in | Wt 189.2 lb

## 2024-03-18 DIAGNOSIS — Z0000 Encounter for general adult medical examination without abnormal findings: Secondary | ICD-10-CM

## 2024-03-18 DIAGNOSIS — K862 Cyst of pancreas: Secondary | ICD-10-CM

## 2024-03-18 DIAGNOSIS — R7301 Impaired fasting glucose: Secondary | ICD-10-CM | POA: Diagnosis not present

## 2024-03-18 DIAGNOSIS — I10 Essential (primary) hypertension: Secondary | ICD-10-CM

## 2024-03-18 DIAGNOSIS — M150 Primary generalized (osteo)arthritis: Secondary | ICD-10-CM

## 2024-03-18 DIAGNOSIS — E782 Mixed hyperlipidemia: Secondary | ICD-10-CM

## 2024-03-18 DIAGNOSIS — Z780 Asymptomatic menopausal state: Secondary | ICD-10-CM

## 2024-03-18 DIAGNOSIS — Z0001 Encounter for general adult medical examination with abnormal findings: Secondary | ICD-10-CM

## 2024-03-18 DIAGNOSIS — M8580 Other specified disorders of bone density and structure, unspecified site: Secondary | ICD-10-CM

## 2024-03-18 LAB — COMPREHENSIVE METABOLIC PANEL WITH GFR
ALT: 22 U/L (ref 0–35)
AST: 28 U/L (ref 0–37)
Albumin: 4.5 g/dL (ref 3.5–5.2)
Alkaline Phosphatase: 102 U/L (ref 39–117)
BUN: 11 mg/dL (ref 6–23)
CO2: 27 meq/L (ref 19–32)
Calcium: 9.6 mg/dL (ref 8.4–10.5)
Chloride: 100 meq/L (ref 96–112)
Creatinine, Ser: 0.8 mg/dL (ref 0.40–1.20)
GFR: 75.62 mL/min (ref >60.00)
Glucose, Bld: 101 mg/dL — ABNORMAL HIGH (ref 70–99)
Potassium: 4.2 meq/L (ref 3.5–5.1)
Sodium: 137 meq/L (ref 135–145)
Total Bilirubin: 0.5 mg/dL (ref 0.2–1.2)
Total Protein: 7.2 g/dL (ref 6.0–8.3)

## 2024-03-18 LAB — LIPID PANEL
Cholesterol: 165 mg/dL (ref 0–200)
HDL: 47.1 mg/dL (ref >39.00)
LDL Cholesterol: 95 mg/dL (ref 0–99)
NonHDL: 117.44
Total CHOL/HDL Ratio: 3
Triglycerides: 113 mg/dL (ref 0.0–149.0)
VLDL: 22.6 mg/dL (ref 0.0–40.0)

## 2024-03-18 LAB — CBC WITH DIFFERENTIAL/PLATELET
Basophils Absolute: 0 K/uL (ref 0.0–0.1)
Basophils Relative: 0.9 % (ref 0.0–3.0)
Eosinophils Absolute: 0.1 K/uL (ref 0.0–0.7)
Eosinophils Relative: 3.1 % (ref 0.0–5.0)
HCT: 39.1 % (ref 36.0–46.0)
Hemoglobin: 13.1 g/dL (ref 12.0–15.0)
Lymphocytes Relative: 24.5 % (ref 12.0–46.0)
Lymphs Abs: 1 K/uL (ref 0.7–4.0)
MCHC: 33.6 g/dL (ref 30.0–36.0)
MCV: 88.3 fl (ref 78.0–100.0)
Monocytes Absolute: 0.4 K/uL (ref 0.1–1.0)
Monocytes Relative: 11 % (ref 3.0–12.0)
Neutro Abs: 2.5 K/uL (ref 1.4–7.7)
Neutrophils Relative %: 60.5 % (ref 43.0–77.0)
Platelets: 274 K/uL (ref 150.0–400.0)
RBC: 4.43 Mil/uL (ref 3.87–5.11)
RDW: 14 % (ref 11.5–15.5)
WBC: 4.1 K/uL (ref 4.0–10.5)

## 2024-03-18 LAB — HEMOGLOBIN A1C: Hgb A1c MFr Bld: 6.1 % (ref 4.6–6.5)

## 2024-03-18 LAB — TSH: TSH: 1.48 u[IU]/mL (ref 0.35–5.50)

## 2024-03-18 MED ORDER — METOPROLOL SUCCINATE ER 100 MG PO TB24: 100.0000 mg | ORAL_TABLET | Freq: Every day | ORAL | 3 refills | Status: AC

## 2024-03-18 MED ORDER — SIMVASTATIN 40 MG PO TABS: ORAL_TABLET | ORAL | 3 refills | Status: AC

## 2024-03-18 NOTE — Patient Instructions (Signed)
 Please return in 12 months for your annual complete physical; please come fasting. For follow up on chronic medical conditions   I will release your lab results to you on your MyChart account with further instructions. You may see the results before I do, but when I review them I will send you a message with my report or have my assistant call you if things need to be discussed. Please reply to my message with any questions. Thank you!   If you have any questions or concerns, please don't hesitate to send me a message via MyChart or call the office at [phone]. Thank you for visiting with us  today! It's our pleasure caring for you.   Please call the office checked below to schedule your appointment for your mammogram and/or bone density screen (the checked studies were ordered): []   Mammogram  [x]   Bone Density  [x]   [facility]     [address] [place], [place]        [phone]         []   [facility]  [address] [place], [place]  [phone]
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
## 2024-03-21 ENCOUNTER — Ambulatory Visit: Payer: Self-pay | Admitting: Family Medicine

## 2024-03-21 NOTE — Progress Notes (Signed)
 Labs reviewed.  The 10-year ASCVD risk score (Arnett DK, et al., 2019) is: 9.5% on statin   Values used to calculate the score:     Age: 68 years     Clincally relevant sex: Female     Is Non-Hispanic African American: No     Diabetic: No     Tobacco smoker: No     Systolic Blood Pressure: 125 mmHg     Is BP treated: Yes     HDL Cholesterol: 47.1 mg/dL     Total Cholesterol: 165 mg/dL

## 2024-06-02 ENCOUNTER — Ambulatory Visit (HOSPITAL_BASED_OUTPATIENT_CLINIC_OR_DEPARTMENT_OTHER)
Admission: RE | Admit: 2024-06-02 | Discharge: 2024-06-02 | Disposition: A | Discharge status: Home / Self Care | Source: Ambulatory Visit | Attending: Family Medicine | Admitting: Family Medicine

## 2024-06-02 DIAGNOSIS — Z780 Asymptomatic menopausal state: Secondary | ICD-10-CM | POA: Diagnosis present

## 2024-06-24 NOTE — Progress Notes (Signed)
 See mychart note Dexa 05/2024 lowest T forearm: -2.3; osteopenia; stable. Recheck 3 years.

## 2025-03-22 ENCOUNTER — Encounter: Admitting: Family Medicine
